# Patient Record
Sex: Male | Born: 1965 | Race: White | Hispanic: No | Marital: Married | Smoking: Never smoker
Health system: Southern US, Community
[De-identification: ages and names within clinical notes are randomized; demographics above are authoritative.]

## PROBLEM LIST (undated history)

## (undated) DIAGNOSIS — T7840XA Allergy, unspecified, initial encounter: Secondary | ICD-10-CM

## (undated) DIAGNOSIS — C61 Malignant neoplasm of prostate: Secondary | ICD-10-CM

## (undated) DIAGNOSIS — Z87898 Personal history of other specified conditions: Secondary | ICD-10-CM

## (undated) DIAGNOSIS — M7630 Iliotibial band syndrome, unspecified leg: Secondary | ICD-10-CM

## (undated) HISTORY — PX: KNEE SURGERY: SHX244

## (undated) HISTORY — DX: Personal history of other specified conditions: Z87.898

## (undated) HISTORY — DX: Malignant neoplasm of prostate: C61

## (undated) HISTORY — DX: Iliotibial band syndrome, unspecified leg: M76.30

## (undated) HISTORY — DX: Allergy, unspecified, initial encounter: T78.40XA

---

## 1993-11-24 HISTORY — PX: APPENDECTOMY: SHX54

## 2013-03-30 ENCOUNTER — Encounter: Payer: Self-pay | Admitting: Family Medicine

## 2013-03-30 ENCOUNTER — Ambulatory Visit (INDEPENDENT_AMBULATORY_CARE_PROVIDER_SITE_OTHER): Payer: 59 | Admitting: Family Medicine

## 2013-03-30 VITALS — BP 130/80 | Temp 98.2°F | Ht 70.0 in | Wt 182.0 lb

## 2013-03-30 DIAGNOSIS — Z0000 Encounter for general adult medical examination without abnormal findings: Secondary | ICD-10-CM | POA: Insufficient documentation

## 2013-03-30 LAB — CBC WITH DIFFERENTIAL/PLATELET
Basophils Relative: 0.9 % (ref 0.0–3.0)
Eosinophils Relative: 4.3 % (ref 0.0–5.0)
MCV: 92.8 fl (ref 78.0–100.0)
Monocytes Absolute: 0.5 10*3/uL (ref 0.1–1.0)
Monocytes Relative: 9.5 % (ref 3.0–12.0)
Neutrophils Relative %: 56.1 % (ref 43.0–77.0)
Platelets: 178 10*3/uL (ref 150.0–400.0)
RBC: 4.69 Mil/uL (ref 4.22–5.81)
WBC: 4.9 10*3/uL (ref 4.5–10.5)

## 2013-03-30 LAB — LIPID PANEL
Cholesterol: 220 mg/dL — ABNORMAL HIGH (ref 0–200)
HDL: 60 mg/dL (ref >39.00)
Total CHOL/HDL Ratio: 4
Triglycerides: 79 mg/dL (ref 0.0–149.0)

## 2013-03-30 LAB — POCT URINALYSIS DIPSTICK
Bilirubin, UA: NEGATIVE
Glucose, UA: NEGATIVE
Ketones, UA: NEGATIVE
Leukocytes, UA: NEGATIVE
Protein, UA: NEGATIVE
Spec Grav, UA: 1.015

## 2013-03-30 LAB — HEPATIC FUNCTION PANEL
ALT: 22 U/L (ref 0–53)
Albumin: 4.2 g/dL (ref 3.5–5.2)
Total Bilirubin: 1.5 mg/dL — ABNORMAL HIGH (ref 0.3–1.2)

## 2013-03-30 LAB — BASIC METABOLIC PANEL
CO2: 27 mEq/L (ref 19–32)
Chloride: 106 mEq/L (ref 96–112)
Potassium: 4.7 mEq/L (ref 3.5–5.1)
Sodium: 138 mEq/L (ref 135–145)

## 2013-03-30 LAB — TSH: TSH: 1.4 u[IU]/mL (ref 0.35–5.50)

## 2013-03-30 LAB — LDL CHOLESTEROL, DIRECT: Direct LDL: 143.3 mg/dL

## 2013-03-30 NOTE — Progress Notes (Signed)
Subjective:    Patient ID: Brian Lowery, male    DOB: 05-14-1966, 47 y.o.   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 No complications since then.  He had an appendectomy in 1994, arthroscopic surgery left knee for torn cartilage . He's had no major illnesses or injuries. He does have springtime pollen allergy. As a child he took allergy shots. This spring things have been quiet allergy wise. He does not smoke he only drinks an occasional drink of alcohol.  Review of systems HEENT were negative except for some high frequency hearing loss bilaterally. He used to be on a Youth worker. Also as a [place] and was exposed to gun shot noise. He gets regular dental care. Cardiopulmonary review of systems negative GI negative GU negative except for a facetectomy and urologic surgery as noted above. Musculoskeletal negative except for the torn cartilage as noted above he does have allergies no asthma. Family history dad has hyperlipidemia at age 30 mother has some hyperlipidemia one brother in good health no sisters. He thinks his vaccinations are up-to-date .  He has yearly exams by Dr. 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 He states his high frequency hearing loss is stable. He also at that yearly cardiac stress test and pulmonary function studies which have all been normal.    He also has a plantar wart on his right heel  He is married and has 2 lovely children   Review of Systems  Constitutional: Negative.   HENT: Negative.   Eyes: Negative.   Respiratory: Negative.   Cardiovascular: Negative.   Gastrointestinal: Negative.   Genitourinary: Negative.   Musculoskeletal: Negative.    Skin: Negative.   Neurological: Negative.   Psychiatric/Behavioral: Negative.        Objective:   Physical Exam  Constitutional: He is oriented to person, place, and time. He appears well-developed and well-nourished.  HENT:  Head: Normocephalic and atraumatic.  Right Ear: External ear normal.  Left Ear: External ear normal.  Nose: Nose normal.  Mouth/Throat: Oropharynx is clear and moist.  Eyes: Conjunctivae and EOM are normal. Pupils are equal, round, and reactive to light.  Neck: Normal range of motion. Neck supple. No JVD present. No tracheal deviation present. No thyromegaly present.  Cardiovascular: Normal rate, regular rhythm, normal heart sounds and intact distal pulses.  Exam reveals no gallop and no friction rub.   No murmur heard. Pulmonary/Chest: Effort normal and breath sounds normal. No stridor. No respiratory distress. He has no wheezes. He has no rales. He exhibits no tenderness.  Abdominal: Soft. Bowel sounds are normal. He exhibits no distension and no mass. There is no tenderness. There is no rebound and no guarding.  Scar lower pubic area from previous urologic surgery at age 8  Genitourinary: Rectum normal, prostate normal and penis normal. Guaiac negative stool. No penile tenderness.  Musculoskeletal: Normal range of motion. He exhibits no edema and no tenderness.  Lymphadenopathy:    He has no cervical adenopathy.  Neurological: He is alert and oriented to person, place, and time. He has normal reflexes. No cranial nerve deficit. He exhibits normal muscle tone.  Skin: Skin is warm and dry. No rash noted. No  erythema. No pallor.  Total body skin exam normal. He has a garden Friday of freckles some moles and capillary hemangiomas all of which appear to be benign.  The plantar wart was frozen with liquid nitrogen  Psychiatric: He has a normal mood and affect. His behavior is normal. Judgment and thought content normal.          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

## 2013-03-30 NOTE — Patient Instructions (Signed)
Continue your good health habits  Apply DuoFilm nightly to the wart on your right heel  If after 3-4 weeks it is not resolved return for retreatment  Ear  protection with any noise exposure  Return in one year for general physical examination sooner if any problems  Remember to wear sunscreens SPF 50+

## 2013-04-04 ENCOUNTER — Encounter: Payer: Self-pay | Admitting: *Deleted

## 2013-06-02 ENCOUNTER — Telehealth: Payer: Self-pay | Admitting: Family Medicine

## 2013-06-02 MED ORDER — NEOMYCIN-POLYMYXIN-HC 3.5-10000-1 OP SUSP: 1.0000 [drp] | Freq: Two times a day (BID) | OPHTHALMIC | Status: DC

## 2013-06-02 NOTE — Telephone Encounter (Signed)
Pt has pink eye or something going on w/ his eye.  Pt leaving on vacation tomorrow and would like to get this chked out. Pt has meeting in the afternnoon. Can we get in this am??

## 2013-06-02 NOTE — Telephone Encounter (Signed)
Per Dr Tawanna Cooler.  Rx sent to pharmacy and patient is aware

## 2013-12-29 ENCOUNTER — Encounter: Payer: Self-pay | Admitting: Family Medicine

## 2013-12-29 ENCOUNTER — Ambulatory Visit (INDEPENDENT_AMBULATORY_CARE_PROVIDER_SITE_OTHER): Payer: 59 | Admitting: Family Medicine

## 2013-12-29 VITALS — BP 120/80 | Temp 97.9°F | Wt 173.0 lb

## 2013-12-29 DIAGNOSIS — J309 Allergic rhinitis, unspecified: Secondary | ICD-10-CM

## 2013-12-29 MED ORDER — FLUTICASONE PROPIONATE 50 MCG/ACT NA SUSP: 2.0000 | Freq: Every day | NASAL | Status: DC

## 2013-12-29 MED ORDER — PREDNISONE 20 MG PO TABS: ORAL_TABLET | ORAL | Status: DC

## 2013-12-29 NOTE — Progress Notes (Signed)
   Subjective:    Patient ID: Brian Lowery, male    DOB: 01/26/1966, 48 y.o.   MRN: [id number]  HPI  Brian Lowery is a 48 year old married male nonsmoker who comes in today for evaluation of a 2 day history of allergic rhinitis of unknown triggers.  Historically when he was young man he had a lot of allergies. He took allergy shots for 7 years. Now as an adult his allergies more spring time. He usually premedicate himself with some antihistamines before the season starts.  2 days ago he began having head congestion sinus pressure postnasal drip. He does not recall a particular trigger. It usually doesn't bother and this time a year  Review of Systems Your systems negative    Objective:   Physical Exam  Well-developed and nourished male no acute distress vital signs stable he is afebrile examination HEENT was pertinent in that he has a slight septal deviation to the left and 4+ nasal edema neck was supple no adenopathy      Assessment & Plan:  Allergic rhinitis flare question etiology

## 2013-12-29 NOTE — Patient Instructions (Signed)
Prednisone 20 mg......... 2 tabs x3 days, 1 tab x3 days, a half a tab x3 days, then a half a tablet Monday Wednesday Friday for a two-week taper  Zyrtec 10 mg plain............ steroid nasal spray 1 shot up each nostril at bedtime........... when you are finished with the prednisone

## 2015-01-01 ENCOUNTER — Encounter: Payer: Self-pay | Admitting: Family Medicine

## 2015-01-01 ENCOUNTER — Ambulatory Visit (INDEPENDENT_AMBULATORY_CARE_PROVIDER_SITE_OTHER): Payer: 59 | Admitting: Family Medicine

## 2015-01-01 VITALS — BP 120/80 | Temp 98.4°F | Wt 173.0 lb

## 2015-01-01 DIAGNOSIS — E789 Disorder of lipoprotein metabolism, unspecified: Secondary | ICD-10-CM | POA: Insufficient documentation

## 2015-01-01 NOTE — Progress Notes (Signed)
   Subjective:    Patient ID: CARTIER WASHKO, male    DOB: October 05, 1966, 49 y.o.   MRN: [id number]  HPI [name] is a 49 year old married male nonsmoker who works for the [employer] who comes in today for follow-up  He had some testing done at the occupational has center by Kristine Royal Hott M.D. which was basically normal. His cholesterol total was slightly elevated however his HDL was high and his LDL was not significantly elevated.  He tells me his mother has high cholesterol is on medication but is not had any vascular events. Father's okay one brother okay.  He's also concerned because his PSA is 3. He does not recall what his previous PSAs were. He had them done before age 57 because of concern no family history of prostate cancer. Urologically he's asymptomatic   Review of Systems Review of systems otherwise negative    Objective:   Physical Exam  Well-developed well-nourished male no acute distress vital signs stable he is afebrile I reviewed all his labs are reassuring that his lipids were normal. PSA was 3 but still under for. He's not sure what his previous numbers were. He'll give me a copy of those numbers and will have another discussion.      Assessment & Plan:  Slightly elevated cholesterol not significant........ continue diet and exercise

## 2015-01-01 NOTE — Progress Notes (Signed)
Pre visit review using our clinic review tool, if applicable. No additional management support is needed unless otherwise documented below in the visit note. 

## 2015-11-25 HISTORY — PX: VASECTOMY: SHX75

## 2016-07-03 ENCOUNTER — Encounter: Payer: Self-pay | Admitting: Family Medicine

## 2016-07-03 ENCOUNTER — Ambulatory Visit (INDEPENDENT_AMBULATORY_CARE_PROVIDER_SITE_OTHER): Payer: 59 | Admitting: Family Medicine

## 2016-07-03 VITALS — BP 118/82 | HR 60 | Temp 98.1°F | Ht 70.0 in | Wt 178.0 lb

## 2016-07-03 DIAGNOSIS — J069 Acute upper respiratory infection, unspecified: Secondary | ICD-10-CM

## 2016-07-03 DIAGNOSIS — J309 Allergic rhinitis, unspecified: Secondary | ICD-10-CM

## 2016-07-03 NOTE — Progress Notes (Signed)
Pre visit review using our clinic review tool, if applicable. No additional management support is needed unless otherwise documented below in the visit note. 

## 2016-07-03 NOTE — Patient Instructions (Signed)
-  zyrtec daily  -flonase 2 sprays each nostril daily for 21 days, then 1 spray each nostril daily if you need to continue  -Afrin nasal spray twice daily for 4 days, then stop - do not use longer then 4 days  -seek care if worsening, new symptoms or symptoms persist longer then expected   INSTRUCTIONS FOR UPPER RESPIRATORY INFECTION:   -nasal saline wash 2-3 times daily (use prepackaged nasal saline or bottled/distilled water if making your own)   -can use tylenol (in no history of liver disease) or ibuprofen (if no history of kidney disease, bowel bleeding or significant heart disease) as directed for aches and sorethroat  -if you are taking a cough medication - use only as directed, may also try a teaspoon of honey to coat the throat and throat lozenges.   -for sore throat, salt water gargles can help  -follow up if you have fevers, facial pain, tooth pain, difficulty breathing or are worsening or symptoms persist longer then expected  Upper Respiratory Infection, Adult An upper respiratory infection (URI) is also known as the common cold. It is often caused by a type of germ (virus). Colds are easily spread (contagious). You can pass it to others by kissing, coughing, sneezing, or drinking out of the same glass. Usually, you get better in 1 to 3  weeks.  However, the cough can last for even longer. HOME CARE   Only take medicine as told by your doctor. Follow instructions provided above.  Drink enough water and fluids to keep your pee (urine) clear or pale yellow.  Get plenty of rest.  Return to work when your temperature is < 100 for 24 hours or as told by your doctor. You may use a face mask and wash your hands to stop your cold from spreading. GET HELP RIGHT AWAY IF:   After the first few days, you feel you are getting worse.  You have questions about your medicine.  You have chills, shortness of breath, or red spit (mucus).  You have pain in the face for more then 1-2  days, especially when you bend forward.  You have a fever, puffy (swollen) neck, pain when you swallow, or white spots in the back of your throat.  You have a bad headache, ear pain, sinus pain, or chest pain.  You have a high-pitched whistling sound when you breathe in and out (wheezing).  You cough up blood.  You have sore muscles or a stiff neck. MAKE SURE YOU:   Understand these instructions.  Will watch your condition.  Will get help right away if you are not doing well or get worse. Document Released: 04/28/2008 Document Revised: 02/02/2012 Document Reviewed: 02/15/2014 [facility] Patient Information 2015 [place], [place]. This information is not intended to replace advice given to you by your health care provider. Make sure you discuss any questions you have with your health care provider.

## 2016-07-03 NOTE — Progress Notes (Signed)
HPI:  Acute visit for URI: -started: allergy issues a few weeks ago, better with zyrtec, then whole family got a cold the last few days and he felt worse, better today -symptoms:nasal congestion, sore throat, cough, ears feel full -denies:fever, SOB, NVD, tooth pain (some a few days ago, now resolved), sinus pain, thick or discolored congestion -has tried: zyrtec -sick contacts/travel/risks: no reported flu, strep or tick exposure - whole family with a cold -Hx of: allergies  ROS: See pertinent positives and negatives per HPI.  Past Medical History:  Diagnosis Date  . Allergy     Past Surgical History:  Procedure Laterality Date  . APPENDECTOMY      Family History  Problem Relation Age of Onset  . Hyperlipidemia Mother   . Hypertension Father   . Stroke Paternal Grandfather   . Heart disease Other     Social History   Social History  . Marital status: Married    Spouse name: N/A  . Number of children: N/A  . Years of education: N/A   Social History Main Topics  . Smoking status: Never Smoker  . Smokeless tobacco: None  . Alcohol use None  . Drug use: Unknown  . Sexual activity: Not Asked   Other Topics Concern  . None   Social History Narrative  . None     Current Outpatient Prescriptions:  .  cetirizine (ZYRTEC) 10 MG tablet, Take 10 mg by mouth as needed for allergies., Disp: , Rfl:  .  fluticasone (FLONASE) 50 MCG/ACT nasal spray, Place into both nostrils daily., Disp: , Rfl:   EXAM:  Vitals:   07/03/16 0936  BP: 118/82  Pulse: 60    Body mass index is 25.54 kg/m.  GENERAL: vitals reviewed and listed above, alert, oriented, appears well hydrated and in no acute distress  HEENT: atraumatic, conjunttiva clear, no obvious abnormalities on inspection of external nose and ears, normal appearance of ear canals and TMs, clear nasal congestion, mild post oropharyngeal erythema with PND, no tonsillar edema or exudate, no sinus TTP  NECK: no  obvious masses on inspection  LUNGS: clear to auscultation bilaterally, no wheezes, rales or rhonchi, good air movement  CV: HRRR, no peripheral edema  MS: moves all extremities without noticeable abnormality  PSYCH: pleasant and cooperative, no obvious depression or anxiety  ASSESSMENT AND PLAN:  Discussed the following assessment and plan:  Allergic rhinitis, unspecified allergic rhinitis type  Acute upper respiratory infection  -given HPI and exam findings today, a serious infection or illness is unlikely. We discussed potential etiologies, with VURI along with AR being most likely. We discussed treatment side effects, likely course, antibiotic misuse, transmission, and signs of developing a serious illness. -zyrtec and flonase, short course nasal decongestant -of course, we advised to return or notify a doctor immediately if symptoms worsen or persist or new concerns arise.    Patient Instructions  -zyrtec daily  -flonase 2 sprays each nostril daily for 21 days, then 1 spray each nostril daily if you need to continue  -Afrin nasal spray twice daily for 4 days, then stop - do not use longer then 4 days  -seek care if worsening, new symptoms or symptoms persist longer then expected   INSTRUCTIONS FOR UPPER RESPIRATORY INFECTION:   -nasal saline wash 2-3 times daily (use prepackaged nasal saline or bottled/distilled water if making your own)   -can use tylenol (in no history of liver disease) or ibuprofen (if no history of kidney disease, bowel bleeding  or significant heart disease) as directed for aches and sorethroat  -if you are taking a cough medication - use only as directed, may also try a teaspoon of honey to coat the throat and throat lozenges.   -for sore throat, salt water gargles can help  -follow up if you have fevers, facial pain, tooth pain, difficulty breathing or are worsening or symptoms persist longer then expected  Upper Respiratory Infection,  Adult An upper respiratory infection (URI) is also known as the common cold. It is often caused by a type of germ (virus). Colds are easily spread (contagious). You can pass it to others by kissing, coughing, sneezing, or drinking out of the same glass. Usually, you get better in 1 to 3  weeks.  However, the cough can last for even longer. HOME CARE   Only take medicine as told by your doctor. Follow instructions provided above.  Drink enough water and fluids to keep your pee (urine) clear or pale yellow.  Get plenty of rest.  Return to work when your temperature is < 100 for 24 hours or as told by your doctor. You may use a face mask and wash your hands to stop your cold from spreading. GET HELP RIGHT AWAY IF:   After the first few days, you feel you are getting worse.  You have questions about your medicine.  You have chills, shortness of breath, or red spit (mucus).  You have pain in the face for more then 1-2 days, especially when you bend forward.  You have a fever, puffy (swollen) neck, pain when you swallow, or white spots in the back of your throat.  You have a bad headache, ear pain, sinus pain, or chest pain.  You have a high-pitched whistling sound when you breathe in and out (wheezing).  You cough up blood.  You have sore muscles or a stiff neck. MAKE SURE YOU:   Understand these instructions.  Will watch your condition.  Will get help right away if you are not doing well or get worse. Document Released: 04/28/2008 Document Revised: 02/02/2012 Document Reviewed: 02/15/2014 [facility] Patient Information 2015 [place], [place]. This information is not intended to replace advice given to you by your health care provider. Make sure you discuss any questions you have with your health care provider.    Colin Benton R., DO

## 2016-11-18 ENCOUNTER — Other Ambulatory Visit (INDEPENDENT_AMBULATORY_CARE_PROVIDER_SITE_OTHER): Payer: 59

## 2016-11-18 DIAGNOSIS — Z0000 Encounter for general adult medical examination without abnormal findings: Secondary | ICD-10-CM | POA: Diagnosis not present

## 2016-11-18 LAB — HEPATIC FUNCTION PANEL
ALBUMIN: 4.3 g/dL (ref 3.5–5.2)
ALK PHOS: 75 U/L (ref 39–117)
ALT: 17 U/L (ref 0–53)
AST: 16 U/L (ref 0–37)
Bilirubin, Direct: 0.1 mg/dL (ref 0.0–0.3)
TOTAL PROTEIN: 6.2 g/dL (ref 6.0–8.3)
Total Bilirubin: 0.8 mg/dL (ref 0.2–1.2)

## 2016-11-18 LAB — TSH: TSH: 1.84 u[IU]/mL (ref 0.35–4.50)

## 2016-11-18 LAB — POC URINALSYSI DIPSTICK (AUTOMATED)
BILIRUBIN UA: NEGATIVE
GLUCOSE UA: NEGATIVE
Ketones, UA: NEGATIVE
Leukocytes, UA: NEGATIVE
Nitrite, UA: NEGATIVE
PH UA: 7
Protein, UA: NEGATIVE
RBC UA: NEGATIVE
SPEC GRAV UA: 1.015
UROBILINOGEN UA: 0.2

## 2016-11-18 LAB — LIPID PANEL
CHOLESTEROL: 223 mg/dL — AB (ref 0–200)
HDL: 66.8 mg/dL (ref >39.00)
LDL Cholesterol: 118 mg/dL — ABNORMAL HIGH (ref 0–99)
NONHDL: 155.73
Total CHOL/HDL Ratio: 3
Triglycerides: 189 mg/dL — ABNORMAL HIGH (ref 0.0–149.0)
VLDL: 37.8 mg/dL (ref 0.0–40.0)

## 2016-11-18 LAB — BASIC METABOLIC PANEL
BUN: 11 mg/dL (ref 6–23)
CHLORIDE: 106 meq/L (ref 96–112)
CO2: 29 mEq/L (ref 19–32)
Calcium: 9.2 mg/dL (ref 8.4–10.5)
Creatinine, Ser: 0.87 mg/dL (ref 0.40–1.50)
GFR: 98.58 mL/min (ref >60.00)
Glucose, Bld: 90 mg/dL (ref 70–99)
Potassium: 4.6 mEq/L (ref 3.5–5.1)
SODIUM: 143 meq/L (ref 135–145)

## 2016-11-18 LAB — PSA: PSA: 2.11 ng/mL (ref 0.10–4.00)

## 2016-11-18 LAB — CBC WITH DIFFERENTIAL/PLATELET
BASOS ABS: 0 10*3/uL (ref 0.0–0.1)
Basophils Relative: 0.5 % (ref 0.0–3.0)
EOS PCT: 8.2 % — AB (ref 0.0–5.0)
Eosinophils Absolute: 0.5 10*3/uL (ref 0.0–0.7)
HEMATOCRIT: 43.8 % (ref 39.0–52.0)
HEMOGLOBIN: 15 g/dL (ref 13.0–17.0)
LYMPHS PCT: 23.8 % (ref 12.0–46.0)
Lymphs Abs: 1.4 10*3/uL (ref 0.7–4.0)
MCHC: 34.2 g/dL (ref 30.0–36.0)
MCV: 90.4 fl (ref 78.0–100.0)
MONOS PCT: 11.3 % (ref 3.0–12.0)
Monocytes Absolute: 0.7 10*3/uL (ref 0.1–1.0)
NEUTROS PCT: 56.2 % (ref 43.0–77.0)
Neutro Abs: 3.3 10*3/uL (ref 1.4–7.7)
Platelets: 202 10*3/uL (ref 150.0–400.0)
RBC: 4.85 Mil/uL (ref 4.22–5.81)
RDW: 13.8 % (ref 11.5–15.5)
WBC: 5.9 10*3/uL (ref 4.0–10.5)

## 2016-11-25 ENCOUNTER — Encounter: Payer: 59 | Admitting: Family Medicine

## 2016-12-02 ENCOUNTER — Encounter: Payer: 59 | Admitting: Family Medicine

## 2016-12-03 ENCOUNTER — Ambulatory Visit (INDEPENDENT_AMBULATORY_CARE_PROVIDER_SITE_OTHER): Payer: 59 | Admitting: Family Medicine

## 2016-12-03 ENCOUNTER — Encounter: Payer: Self-pay | Admitting: Family Medicine

## 2016-12-03 VITALS — BP 122/80 | HR 65 | Temp 98.3°F | Ht 70.0 in | Wt 183.6 lb

## 2016-12-03 DIAGNOSIS — Z0000 Encounter for general adult medical examination without abnormal findings: Secondary | ICD-10-CM | POA: Diagnosis not present

## 2016-12-03 DIAGNOSIS — J309 Allergic rhinitis, unspecified: Secondary | ICD-10-CM

## 2016-12-03 DIAGNOSIS — Z23 Encounter for immunization: Secondary | ICD-10-CM | POA: Diagnosis not present

## 2016-12-03 MED ORDER — MONTELUKAST SODIUM 10 MG PO TABS: 10.0000 mg | ORAL_TABLET | Freq: Every day | ORAL | 3 refills | Status: DC

## 2016-12-03 MED ORDER — FLUTICASONE PROPIONATE 50 MCG/ACT NA SUSP: 1.0000 | Freq: Every day | NASAL | 6 refills | Status: DC

## 2016-12-03 NOTE — Patient Instructions (Signed)
Continue good diet exercise and health habits.  Follow-up in one year sooner if any problems  You'll get a call from GI about an appointment to discuss screening colonoscopy  Add Singulair 10 mg.......... one daily at bedtime if the Zyrtec and steroid nasal spray or not helping her allergy symptoms.

## 2016-12-03 NOTE — Progress Notes (Signed)
Brian Lowery is a 51 year old married male nonsmoker who works with the [employer] who comes in today for general physical examination he has underlying allergic rhinitis. He uses Zyrtec and steroid nasal spray.  He's always been in excellent health he said no chronic health problems except above.  He gets routine eye care, dental care, due for screening colonoscopy at age 75. No family history of colon cancer polyps.  He has screening through: Occupational health by Dr. 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 Screening includes physical exam hearing PFT and labs. This was done in the spring of 2017. All studies were normal.  Mother recent diagnosed with breast cancer dates 79. She also has high cholesterol.. Dad has hypertension and A. fib. No sisters one brother and she is in good health  Vaccinations last tetanus booster was in 2006. He will be given a booster today.Marland Kitchen He had a flu shot at the work. November 2017.  Social history,,,,,,,,,, married lives here in [place] 2 children works for the [employer], trade union. Excellent physical shape he bicycles sometimes up to 40 miles at a time.  In the past he's had an elevated LDL which is borderline. Through diet and exercise she's dropped his LDL down to 118.  14 point review of systems reviewed and otherwise negative  s  BP 122/80 (BP Location: Left Arm, Patient Position: Sitting, Cuff Size: Normal)   Pulse 65   Temp 98.3 F (36.8 C) (Oral)   Ht 5\' 10"  (1.778 m)   Wt 183 lb 9.6 oz (83.3 kg)   BMI 26.34 kg/m  Examination of the HEENT were negative neck was supple thyroid is nonenlarged no carotid bruits cardiopulmonary exam normal abdominal exam normal genitalia normal circumcised male rectum normal stool guaiac-negative prostate normal. Extremities normal skin normal peripheral pulses normal.  #1 healthy male  #2 allergic rhinitis continue medications as above at Singulair if above is not working.Marland Kitchenv

## 2016-12-08 ENCOUNTER — Encounter: Payer: Self-pay | Admitting: Gastroenterology

## 2017-01-12 ENCOUNTER — Ambulatory Visit (AMBULATORY_SURGERY_CENTER): Payer: Self-pay

## 2017-01-12 VITALS — Ht 70.0 in | Wt 178.0 lb

## 2017-01-12 DIAGNOSIS — Z1211 Encounter for screening for malignant neoplasm of colon: Secondary | ICD-10-CM

## 2017-01-12 MED ORDER — SUPREP BOWEL PREP KIT 17.5-3.13-1.6 GM/177ML PO SOLN: 1.0000 | Freq: Once | ORAL | 0 refills | Status: AC

## 2017-01-12 NOTE — Progress Notes (Signed)
No allergies to eggs or soy No past problems with anesthesia No diet meds No home oxygen  Registered for emmi 

## 2017-01-20 DIAGNOSIS — L821 Other seborrheic keratosis: Secondary | ICD-10-CM | POA: Diagnosis not present

## 2017-01-20 DIAGNOSIS — D1801 Hemangioma of skin and subcutaneous tissue: Secondary | ICD-10-CM | POA: Diagnosis not present

## 2017-01-20 DIAGNOSIS — L814 Other melanin hyperpigmentation: Secondary | ICD-10-CM | POA: Diagnosis not present

## 2017-01-22 ENCOUNTER — Telehealth: Payer: Self-pay | Admitting: Gastroenterology

## 2017-01-22 NOTE — Telephone Encounter (Signed)
Reprinted Suprep instructions with new date and time and snail mailed a hard-copy to patient.  Patient called and informed per Riki Sheer RN. Tomie Elko/PV

## 2017-01-26 ENCOUNTER — Encounter: Payer: 59 | Admitting: Gastroenterology

## 2017-01-29 ENCOUNTER — Encounter: Payer: Self-pay | Admitting: Gastroenterology

## 2017-01-30 ENCOUNTER — Telehealth: Payer: Self-pay | Admitting: Gastroenterology

## 2017-01-30 NOTE — Telephone Encounter (Signed)
Pt notified and aware to come pick up a new coupon for Suprep,

## 2017-02-09 ENCOUNTER — Encounter: Payer: 59 | Admitting: Gastroenterology

## 2017-02-12 ENCOUNTER — Ambulatory Visit (AMBULATORY_SURGERY_CENTER): Payer: 59 | Admitting: Gastroenterology

## 2017-02-12 ENCOUNTER — Encounter: Payer: Self-pay | Admitting: Gastroenterology

## 2017-02-12 VITALS — BP 103/74 | HR 57 | Temp 98.9°F | Resp 18 | Ht 70.0 in | Wt 178.0 lb

## 2017-02-12 DIAGNOSIS — D123 Benign neoplasm of transverse colon: Secondary | ICD-10-CM

## 2017-02-12 DIAGNOSIS — Z1211 Encounter for screening for malignant neoplasm of colon: Secondary | ICD-10-CM | POA: Diagnosis present

## 2017-02-12 DIAGNOSIS — Z1212 Encounter for screening for malignant neoplasm of rectum: Secondary | ICD-10-CM

## 2017-02-12 MED ORDER — SODIUM CHLORIDE 0.9 % IV SOLN: 500.0000 mL | INTRAVENOUS | Status: DC

## 2017-02-12 NOTE — Op Note (Signed)
[facility] Patient Name: Brian Lowery Procedure Date: 02/12/2017 2:39 PM MRN: [id number] Endoscopist: Mallie Mussel L. Loletha Carrow , MD Age: 51 Referring MD:  Date of Birth: October 05, 1966 Gender: Male Account #: [id number] Procedure:                Colonoscopy Indications:              Screening for colorectal malignant neoplasm, This                            is the patient's first colonoscopy Medicines:                Monitored Anesthesia Care Procedure:                Pre-Anesthesia Assessment:                           - Prior to the procedure, a History and Physical                            was performed, and patient medications and                            allergies were reviewed. The patient's tolerance of                            previous anesthesia was also reviewed. The risks                            and benefits of the procedure and the sedation                            options and risks were discussed with the patient.                            All questions were answered, and informed consent                            was obtained. Prior Anticoagulants: The patient has                            taken no previous anticoagulant or antiplatelet                            agents. ASA Grade Assessment: II - A patient with                            mild systemic disease. After reviewing the risks                            and benefits, the patient was deemed in                            satisfactory condition to undergo the procedure.  After obtaining informed consent, the colonoscope                            was passed under direct vision. Throughout the                            procedure, the patient's blood pressure, pulse, and                            oxygen saturations were monitored continuously. The                            Colonoscope was introduced through the anus and                            advanced to the the cecum,  identified by                            appendiceal orifice and ileocecal valve. The                            colonoscopy was performed without difficulty. The                            patient tolerated the procedure well. The quality                            of the bowel preparation was good. The ileocecal                            valve, appendiceal orifice, and rectum were                            photographed. The quality of the bowel preparation                            was evaluated using the BBPS [facility] Bowel                            Preparation Scale) with scores of: Right Colon = 2,                            Transverse Colon = 2 and Left Colon = 2. The total                            BBPS score equals 6. The bowel preparation used was                            SUPREP. Scope In: 3:10:09 PM Scope Out: 3:24:40 PM Scope Withdrawal Time: 0 hours 10 minutes 28 seconds  Total Procedure Duration: 0 hours 14 minutes 31 seconds  Findings:                 The perianal and digital rectal  examinations were                            normal.                           A 2 mm polyp was found in the distal transverse                            colon. The polyp was sessile. The polyp was removed                            with a cold snare. Resection and retrieval were                            complete.                           The exam was otherwise without abnormality on                            direct and retroflexion views. Complications:            No immediate complications. Estimated Blood Loss:     Estimated blood loss: none. Impression:               - One 2 mm polyp in the distal transverse colon,                            removed with a cold snare. Resected and retrieved.                           - The examination was otherwise normal on direct                            and retroflexion views. Recommendation:           - Patient has a contact number available  for                            emergencies. The signs and symptoms of potential                            delayed complications were discussed with the                            patient. Return to normal activities tomorrow.                            Written discharge instructions were provided to the                            patient.                           - Resume previous diet.                           -  Continue present medications.                           - Await pathology results.                           - Repeat colonoscopy is recommended for                            surveillance. The colonoscopy date will be                            determined after pathology results from today's                            exam become available for review. Shontel Santee L. Loletha Carrow, MD 02/12/2017 3:34:02 PM This report has been signed electronically.

## 2017-02-12 NOTE — Patient Instructions (Signed)
Impression/recommendations:  [place] (handout given)  YOU HAD AN ENDOSCOPIC PROCEDURE TODAY AT THE [facility]:   Refer to the procedure report that was given to you for any specific questions about what was found during the examination.  If the procedure report does not answer your questions, please call your gastroenterologist to clarify.  If you requested that your care partner not be given the details of your procedure findings, then the procedure report has been included in a sealed envelope for you to review at your convenience later.  YOU SHOULD EXPECT: Some feelings of bloating in the abdomen. Passage of more gas than usual.  Walking can help get rid of the air that was put into your GI tract during the procedure and reduce the bloating. If you had a lower endoscopy (such as a colonoscopy or flexible sigmoidoscopy) you may notice spotting of blood in your stool or on the toilet paper. If you underwent a bowel prep for your procedure, you may not have a normal bowel movement for a few days.  Please Note:  You might notice some irritation and congestion in your nose or some drainage.  This is from the oxygen used during your procedure.  There is no need for concern and it should clear up in a day or so.  SYMPTOMS TO REPORT IMMEDIATELY:   Following lower endoscopy (colonoscopy or flexible sigmoidoscopy):  Excessive amounts of blood in the stool  Significant tenderness or worsening of abdominal pains  Swelling of the abdomen that is new, acute  Fever of 100F or higher  For urgent or emergent issues, a gastroenterologist can be reached at any hour by calling [phone].   DIET:  We do recommend a small meal at first, but then you may proceed to your regular diet.  Drink plenty of fluids but you should avoid alcoholic beverages for 24 hours.  ACTIVITY:  You should plan to take it easy for the rest of today and you should NOT DRIVE or use heavy machinery until tomorrow  (because of the sedation medicines used during the test).    FOLLOW UP: Our staff will call the number listed on your records the next business day following your procedure to check on you and address any questions or concerns that you may have regarding the information given to you following your procedure. If we do not reach you, we will leave a message.  However, if you are feeling well and you are not experiencing any problems, there is no need to return our call.  We will assume that you have returned to your regular daily activities without incident.  If any biopsies were taken you will be contacted by phone or by letter within the next 1-3 weeks.  Please call us at [phone] if you have not heard about the biopsies in 3 weeks.    SIGNATURES/CONFIDENTIALITY: You and/or your care partner have signed paperwork which will be entered into your electronic medical record.  These signatures attest to the fact that that the information above on your After Visit Summary has been reviewed and is understood.  Full responsibility of the confidentiality of this discharge information lies with you and/or your care-partner. 

## 2017-02-12 NOTE — Progress Notes (Signed)
Pt's states no medical or surgical changes since previsit or office visit. 

## 2017-02-12 NOTE — Progress Notes (Signed)
Called to room to assist during endoscopic procedure.  Patient ID and intended procedure confirmed with present staff. Received instructions for my participation in the procedure from the performing physician.  

## 2017-02-12 NOTE — Progress Notes (Signed)
To PACU, vss patent aw report to rn 

## 2017-02-13 ENCOUNTER — Telehealth: Payer: Self-pay | Admitting: *Deleted

## 2017-02-13 NOTE — Telephone Encounter (Signed)
  Follow up Call-  Call back number 02/12/2017  Post procedure Call Back phone  # [phone]  Permission to leave phone message Yes  Some recent data might be hidden     Patient questions:  Do you have a fever, pain , or abdominal swelling? No. Pain Score  0 *  Have you tolerated food without any problems? Yes.    Have you been able to return to your normal activities? Yes.    Do you have any questions about your discharge instructions: Diet   No. Medications  No. Follow up visit  No.  Do you have questions or concerns about your Care? No.  Actions: * If pain score is 4 or above: No action needed, pain <4.

## 2017-02-18 ENCOUNTER — Encounter: Payer: Self-pay | Admitting: Gastroenterology

## 2017-04-23 DIAGNOSIS — H524 Presbyopia: Secondary | ICD-10-CM | POA: Diagnosis not present

## 2017-04-23 DIAGNOSIS — H5203 Hypermetropia, bilateral: Secondary | ICD-10-CM | POA: Diagnosis not present

## 2017-10-17 DIAGNOSIS — R030 Elevated blood-pressure reading, without diagnosis of hypertension: Secondary | ICD-10-CM | POA: Diagnosis not present

## 2017-10-17 DIAGNOSIS — J019 Acute sinusitis, unspecified: Secondary | ICD-10-CM | POA: Diagnosis not present

## 2017-12-28 ENCOUNTER — Ambulatory Visit
Admission: RE | Admit: 2017-12-28 | Discharge: 2017-12-28 | Disposition: A | Discharge status: Home / Self Care | Payer: 59 | Source: Ambulatory Visit | Attending: Chiropractic Medicine | Admitting: Chiropractic Medicine

## 2017-12-28 ENCOUNTER — Other Ambulatory Visit: Payer: Self-pay | Admitting: Chiropractic Medicine

## 2017-12-28 DIAGNOSIS — M9905 Segmental and somatic dysfunction of pelvic region: Secondary | ICD-10-CM | POA: Diagnosis not present

## 2017-12-28 DIAGNOSIS — M9903 Segmental and somatic dysfunction of lumbar region: Secondary | ICD-10-CM | POA: Diagnosis not present

## 2017-12-28 DIAGNOSIS — M25551 Pain in right hip: Secondary | ICD-10-CM | POA: Diagnosis not present

## 2017-12-28 DIAGNOSIS — M9904 Segmental and somatic dysfunction of sacral region: Secondary | ICD-10-CM | POA: Diagnosis not present

## 2018-01-01 DIAGNOSIS — M9904 Segmental and somatic dysfunction of sacral region: Secondary | ICD-10-CM | POA: Diagnosis not present

## 2018-01-01 DIAGNOSIS — M9905 Segmental and somatic dysfunction of pelvic region: Secondary | ICD-10-CM | POA: Diagnosis not present

## 2018-01-01 DIAGNOSIS — M9903 Segmental and somatic dysfunction of lumbar region: Secondary | ICD-10-CM | POA: Diagnosis not present

## 2018-01-04 DIAGNOSIS — M9903 Segmental and somatic dysfunction of lumbar region: Secondary | ICD-10-CM | POA: Diagnosis not present

## 2018-01-04 DIAGNOSIS — M9905 Segmental and somatic dysfunction of pelvic region: Secondary | ICD-10-CM | POA: Diagnosis not present

## 2018-01-04 DIAGNOSIS — M9904 Segmental and somatic dysfunction of sacral region: Secondary | ICD-10-CM | POA: Diagnosis not present

## 2018-01-15 DIAGNOSIS — M9903 Segmental and somatic dysfunction of lumbar region: Secondary | ICD-10-CM | POA: Diagnosis not present

## 2018-01-15 DIAGNOSIS — M9904 Segmental and somatic dysfunction of sacral region: Secondary | ICD-10-CM | POA: Diagnosis not present

## 2018-01-15 DIAGNOSIS — M9905 Segmental and somatic dysfunction of pelvic region: Secondary | ICD-10-CM | POA: Diagnosis not present

## 2018-02-15 DIAGNOSIS — L814 Other melanin hyperpigmentation: Secondary | ICD-10-CM | POA: Diagnosis not present

## 2018-02-15 DIAGNOSIS — D1801 Hemangioma of skin and subcutaneous tissue: Secondary | ICD-10-CM | POA: Diagnosis not present

## 2018-02-15 DIAGNOSIS — L821 Other seborrheic keratosis: Secondary | ICD-10-CM | POA: Diagnosis not present

## 2018-03-30 DIAGNOSIS — H10413 Chronic giant papillary conjunctivitis, bilateral: Secondary | ICD-10-CM | POA: Diagnosis not present

## 2018-04-28 DIAGNOSIS — M9904 Segmental and somatic dysfunction of sacral region: Secondary | ICD-10-CM | POA: Diagnosis not present

## 2018-04-28 DIAGNOSIS — M5442 Lumbago with sciatica, left side: Secondary | ICD-10-CM | POA: Diagnosis not present

## 2018-04-28 DIAGNOSIS — M9903 Segmental and somatic dysfunction of lumbar region: Secondary | ICD-10-CM | POA: Diagnosis not present

## 2018-05-03 DIAGNOSIS — M9903 Segmental and somatic dysfunction of lumbar region: Secondary | ICD-10-CM | POA: Diagnosis not present

## 2018-05-03 DIAGNOSIS — M5442 Lumbago with sciatica, left side: Secondary | ICD-10-CM | POA: Diagnosis not present

## 2018-05-03 DIAGNOSIS — M9904 Segmental and somatic dysfunction of sacral region: Secondary | ICD-10-CM | POA: Diagnosis not present

## 2018-07-15 DIAGNOSIS — M9904 Segmental and somatic dysfunction of sacral region: Secondary | ICD-10-CM | POA: Diagnosis not present

## 2018-07-15 DIAGNOSIS — M5442 Lumbago with sciatica, left side: Secondary | ICD-10-CM | POA: Diagnosis not present

## 2018-07-15 DIAGNOSIS — M9903 Segmental and somatic dysfunction of lumbar region: Secondary | ICD-10-CM | POA: Diagnosis not present

## 2018-07-27 DIAGNOSIS — M9904 Segmental and somatic dysfunction of sacral region: Secondary | ICD-10-CM | POA: Diagnosis not present

## 2018-07-27 DIAGNOSIS — M9903 Segmental and somatic dysfunction of lumbar region: Secondary | ICD-10-CM | POA: Diagnosis not present

## 2018-07-27 DIAGNOSIS — M9905 Segmental and somatic dysfunction of pelvic region: Secondary | ICD-10-CM | POA: Diagnosis not present

## 2019-01-12 LAB — PSA: PSA: 2.9

## 2019-01-12 LAB — LIPID PANEL
Cholesterol: 243 — AB (ref 0–200)
HDL: 68 (ref 35–70)
LDL Cholesterol: 153
Triglycerides: 107 (ref 40–160)

## 2019-01-12 LAB — BASIC METABOLIC PANEL: Glucose: 86

## 2019-02-16 ENCOUNTER — Telehealth: Payer: Self-pay

## 2019-02-16 NOTE — Telephone Encounter (Signed)
Left message making patient aware that her visit will be a virtual visit and to check their. WebEx appointment made and e-mail has been sent to the patient.

## 2019-02-23 ENCOUNTER — Other Ambulatory Visit: Payer: Self-pay

## 2019-02-23 ENCOUNTER — Encounter: Payer: Self-pay | Admitting: Family Medicine

## 2019-02-23 ENCOUNTER — Ambulatory Visit (INDEPENDENT_AMBULATORY_CARE_PROVIDER_SITE_OTHER): Payer: 59 | Admitting: Family Medicine

## 2019-02-23 DIAGNOSIS — H9193 Unspecified hearing loss, bilateral: Secondary | ICD-10-CM | POA: Diagnosis not present

## 2019-02-23 DIAGNOSIS — R972 Elevated prostate specific antigen [PSA]: Secondary | ICD-10-CM

## 2019-02-23 DIAGNOSIS — E785 Hyperlipidemia, unspecified: Secondary | ICD-10-CM

## 2019-02-23 DIAGNOSIS — H9190 Unspecified hearing loss, unspecified ear: Secondary | ICD-10-CM | POA: Insufficient documentation

## 2019-02-23 NOTE — Progress Notes (Signed)
Virtual Visit via Video Note  I connected with@ on 02/23/19 at  9:30 AM EDT by a video enabled telemedicine application and verified that I am speaking with the correct person using two identifiers.  Location patient: home Location provider:work or home office Persons participating in the virtual visit: patient, provider  I discussed the limitations of evaluation and management by telemedicine and the availability of in person appointments. The patient expressed understanding and agreed to proceed.   Brian Lowery DOB: 1966/06/22 Encounter date: 02/23/2019  This is a 53 y.o. male who presents to establish care. Chief Complaint  Patient presents with  . Transitions Of Care   Previous patient of Dr. [name] (last visit was 11/2016)  History of present illness: Visit expedited due to elevated PSA in past; but had jump on recent bloodwork at work. Jumped from 2.3 to 2.9 (February 2019 to 2020). Other bloodwork was "stable" (see lab table as this has been entered from being viewed on screen shot during webex).   Usually runs or goes to gym daily; does a lot of trail riding. Feels healthy.    Allergies: Had allergies from childhood. Took claritin regularly and would get eye swelling, severe congestion, sleeping sitting up at night. As he has aged it is improving. Even did allergy shots as child. Taking zyrtec right now intermittently and this is working well for him. Keeps N95 mask on to mow.   bloodwork through employer: most recently done in January 2020.  Colonoscopy 01/2017:single tubular adenoma.  Past Medical History:  Diagnosis Date  . Allergy    Past Surgical History:  Procedure Laterality Date  . APPENDECTOMY  1995  . KNEE SURGERY Left    11 years ago. Meniscal tear repair arthroscopic.  Marland Kitchen VASECTOMY  2017   No Known Allergies Current Meds  Medication Sig  . cetirizine (ZYRTEC) 10 MG tablet Take 10 mg by mouth as needed for allergies.  . fluticasone (FLONASE) 50 MCG/ACT  nasal spray Place 1 spray into both nostrils daily.  . [DISCONTINUED] montelukast (SINGULAIR) 10 MG tablet Take 1 tablet (10 mg total) by mouth at bedtime.   Current Facility-Administered Medications for the 02/23/19 encounter (Office Visit) with Caren Macadam, MD  Medication  . 0.9 %  sodium chloride infusion   Social History   Tobacco Use  . Smoking status: Never Smoker  . Smokeless tobacco: Never Used  Substance Use Topics  . Alcohol use: Yes    Comment: ocassionally; weekends   Family History  Problem Relation Age of Onset  . Hyperlipidemia Mother   . Breast cancer Mother   . Hypertension Father   . Atrial fibrillation Father   . Heart disease Paternal Grandfather 65       sudden cardiac death without history  . Stroke Maternal Grandfather 37  . High Cholesterol Maternal Grandfather   . Other Maternal Grandfather        smoker  . Dementia Maternal Grandmother 3  . Other Paternal Grandmother        no known medical problems 90+  . Colon cancer Neg Hx      Review of Systems  Constitutional: Negative for chills, fatigue and fever.  Respiratory: Negative for cough, chest tightness, shortness of breath and wheezing.   Cardiovascular: Negative for chest pain, palpitations and leg swelling.  Genitourinary: Negative for decreased urine volume, difficulty urinating and frequency.    Objective:  There were no vitals taken for this visit.      BP Readings  from Last 3 Encounters:  02/12/17 103/74  12/03/16 122/80  07/03/16 118/82   Wt Readings from Last 3 Encounters:  02/12/17 178 lb (80.7 kg)  01/12/17 178 lb (80.7 kg)  12/03/16 183 lb 9.6 oz (83.3 kg)    EXAM:  GENERAL: alert, oriented, appears well and in no acute distress  HEENT: atraumatic, conjunctiva clear, no obvious abnormalities on inspection of external nose and ears  NECK: normal movements of the head and neck  LUNGS: on inspection no signs of respiratory distress, breathing rate appears  normal, no obvious gross SOB, gasping or wheezing  CV: no obvious cyanosis  MS: moves all visible extremities without noticeable abnormality  PSYCH/NEURO: pleasant and cooperative, no obvious depression or anxiety, speech and thought processing grossly intact  Assessment/Plan 1. Hyperlipidemia, unspecified hyperlipidemia type Discussed monitoring cholesterol closely and possible need for statin as age elevates to help with risk factor control. Given family history could consider coronary calcium as well.  - TSH; Future  2. 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   3. Elevated PSA Will recheck in 3 months time; encouraged no ejaculation prior to bloodwork.  - PSA, Total and Free; Future   He does see derm regularly; asked him to have them forward note to [place] at next visit.  I discussed the assessment and treatment plan with the patient. The patient was provided an opportunity to ask questions and all were answered. The patient agreed with the plan and demonstrated an understanding of the instructions.   The patient was advised to call back or seek an in-person evaluation if the symptoms worsen or if the condition fails to improve as anticipated.   Micheline Rough, MD

## 2019-04-29 ENCOUNTER — Other Ambulatory Visit (INDEPENDENT_AMBULATORY_CARE_PROVIDER_SITE_OTHER): Payer: 59

## 2019-04-29 ENCOUNTER — Other Ambulatory Visit: Payer: Self-pay

## 2019-04-29 DIAGNOSIS — E785 Hyperlipidemia, unspecified: Secondary | ICD-10-CM | POA: Diagnosis not present

## 2019-04-29 DIAGNOSIS — R972 Elevated prostate specific antigen [PSA]: Secondary | ICD-10-CM | POA: Diagnosis not present

## 2019-04-29 LAB — TSH: TSH: 1.89 u[IU]/mL (ref 0.35–4.50)

## 2019-05-02 LAB — PSA, TOTAL AND FREE
PSA, % Free: 20 % (calc) — ABNORMAL LOW (ref >25)
PSA, Free: 0.5 ng/mL
PSA, Total: 2.5 ng/mL (ref <=4.0)

## 2021-02-06 ENCOUNTER — Other Ambulatory Visit: Payer: Self-pay

## 2021-02-06 ENCOUNTER — Ambulatory Visit
Admission: RE | Admit: 2021-02-06 | Discharge: 2021-02-06 | Disposition: A | Discharge status: Home / Self Care | Payer: 59 | Source: Ambulatory Visit | Attending: Nurse Practitioner | Admitting: Nurse Practitioner

## 2021-02-06 ENCOUNTER — Other Ambulatory Visit: Payer: Self-pay | Admitting: Nurse Practitioner

## 2021-02-06 DIAGNOSIS — Z0289 Encounter for other administrative examinations: Secondary | ICD-10-CM

## 2022-02-10 ENCOUNTER — Encounter: Payer: Self-pay | Admitting: Gastroenterology

## 2022-09-17 ENCOUNTER — Encounter: Payer: Self-pay | Admitting: Family Medicine

## 2022-09-17 ENCOUNTER — Ambulatory Visit (INDEPENDENT_AMBULATORY_CARE_PROVIDER_SITE_OTHER): Payer: 59 | Admitting: Family Medicine

## 2022-09-17 VITALS — BP 132/90 | HR 65 | Temp 97.7°F | Ht 70.0 in | Wt 181.8 lb

## 2022-09-17 DIAGNOSIS — Z23 Encounter for immunization: Secondary | ICD-10-CM

## 2022-09-17 DIAGNOSIS — R972 Elevated prostate specific antigen [PSA]: Secondary | ICD-10-CM | POA: Insufficient documentation

## 2022-09-17 DIAGNOSIS — E782 Mixed hyperlipidemia: Secondary | ICD-10-CM

## 2022-09-17 DIAGNOSIS — M7631 Iliotibial band syndrome, right leg: Secondary | ICD-10-CM | POA: Diagnosis not present

## 2022-09-17 LAB — LIPID PANEL
Cholesterol: 233 mg/dL — ABNORMAL HIGH (ref 0–200)
HDL: 73.7 mg/dL (ref >39.00)
LDL Cholesterol: 144 mg/dL — ABNORMAL HIGH (ref 0–99)
NonHDL: 159.42
Total CHOL/HDL Ratio: 3
Triglycerides: 75 mg/dL (ref 0.0–149.0)
VLDL: 15 mg/dL (ref 0.0–40.0)

## 2022-09-17 LAB — COMPREHENSIVE METABOLIC PANEL
ALT: 18 U/L (ref 0–53)
AST: 18 U/L (ref 0–37)
Albumin: 4.6 g/dL (ref 3.5–5.2)
Alkaline Phosphatase: 62 U/L (ref 39–117)
BUN: 13 mg/dL (ref 6–23)
CO2: 28 mEq/L (ref 19–32)
Calcium: 9.7 mg/dL (ref 8.4–10.5)
Chloride: 103 mEq/L (ref 96–112)
Creatinine, Ser: 0.8 mg/dL (ref 0.40–1.50)
GFR: 99.16 mL/min (ref >60.00)
Glucose, Bld: 96 mg/dL (ref 70–99)
Potassium: 4.4 mEq/L (ref 3.5–5.1)
Sodium: 138 mEq/L (ref 135–145)
Total Bilirubin: 2 mg/dL — ABNORMAL HIGH (ref 0.2–1.2)
Total Protein: 6.5 g/dL (ref 6.0–8.3)

## 2022-09-17 LAB — PSA: PSA: 3.82 ng/mL (ref 0.10–4.00)

## 2022-09-17 NOTE — Assessment & Plan Note (Signed)
Last level in 2020 was 2.5, will continue to check this yearly, labs ordered today

## 2022-09-17 NOTE — Assessment & Plan Note (Addendum)
Reviewed last lipid panel from 2020, will obtain new lipid panel today, not on medication for this, he is using diet changes and exercise to control the cholesterol curently, will continue this current plan.

## 2022-09-17 NOTE — Progress Notes (Signed)
New Patient Office Visit  Subjective    Patient ID: Brian Lowery, male    DOB: 03/18/1966  Age: 56 y.o. MRN: [id number]  CC:  Chief Complaint  Patient presents with   Establish Care    HPI Brian Lowery presents to establish care Patient was last seen by our office 3 years ago. Pt reports that he was a former Dr. [name] patient, states that he does not have any health problems at this time. States that he was seeing Dr. Paulla Fore for IT band syndrome, states that he started magnesium about 2 weeks now, states that it helping.   Patient has a history of elevated cholesterol, states he has a family history of elevated cholesterol as well. States that he is trying to watch his diet and he exercises regularly. He denies any SOB, no chest pain at this time.  Hx elevated PSA-- pt reports positive family history of breast and prostate cancer (mother and brother respectively). He reports he has had elevated PSA in the past. He denies any urinary symptoms at this time, no hesitancy, no increased frequency, etc.  I reviewed his health maintenance, his last colonoscopy was in 2018 and only showed 1 small tubular adenoma, he does not need repeat scope at this time. He is agreeable to getting his shingrix vaccination series.   Outpatient Encounter Medications as of 09/17/2022  Medication Sig   cetirizine (ZYRTEC) 10 MG tablet Take 10 mg by mouth as needed for allergies.   MAGNESIUM PO Take by mouth 2 (two) times daily.   [DISCONTINUED] fluticasone (FLONASE) 50 MCG/ACT nasal spray Place 1 spray into both nostrils daily.   Facility-Administered Encounter Medications as of 09/17/2022  Medication   0.9 %  sodium chloride infusion    Past Medical History:  Diagnosis Date   Allergy    History of elevated PSA    IT band syndrome     Past Surgical History:  Procedure Laterality Date   APPENDECTOMY  1995   KNEE SURGERY Left    11 years ago. Meniscal tear repair arthroscopic.   VASECTOMY   2017    Family History  Problem Relation Age of Onset   Hyperlipidemia Mother    Breast cancer Mother    Hypertension Father    Atrial fibrillation Father    Prostate cancer Brother    Dementia Maternal Grandmother 34   Stroke Maternal Grandfather 53   High Cholesterol Maternal Grandfather    Other Maternal Grandfather        smoker   Other Paternal Grandmother        no known medical problems 90+   Heart disease Paternal Grandfather 55       sudden cardiac death without history   Colon cancer Neg Hx     Social History   Socioeconomic History   Marital status: Married    Spouse name: Not on file   Number of children: Not on file   Years of education: Not on file   Highest education level: Not on file  Occupational History   Not on file  Tobacco Use   Smoking status: Never   Smokeless tobacco: Never  Vaping Use   Vaping Use: Never used  Substance and Sexual Activity   Alcohol use: Yes    Comment: ocassionally; weekends   Drug use: No   Sexual activity: Not Currently  Other Topics Concern   Not on file  Social History Narrative   Not on file   Social  Determinants of Health   Financial Resource Strain: Not on file  Food Insecurity: Not on file  Transportation Needs: Not on file  Physical Activity: Not on file  Stress: Not on file  Social Connections: Not on file  Intimate Partner Violence: Not on file    Review of Systems  All other systems reviewed and are negative.       Objective    BP (!) 132/90 (BP Location: Left Arm, Patient Position: Sitting, Cuff Size: Normal)   Pulse 65   Temp 97.7 F (36.5 C) (Oral)   Ht '5\' 10"'$  (1.778 m)   Wt 181 lb 12.8 oz (82.5 kg)   SpO2 98%   BMI 26.09 kg/m   Physical Exam Vitals reviewed.  Constitutional:      Appearance: Normal appearance. He is well-groomed and normal weight.  Eyes:     Extraocular Movements: Extraocular movements intact.     Conjunctiva/sclera: Conjunctivae normal.  Neck:     Thyroid:  No thyromegaly.  Cardiovascular:     Rate and Rhythm: Normal rate and regular rhythm.     Heart sounds: S1 normal and S2 normal. No murmur heard. Pulmonary:     Effort: Pulmonary effort is normal.     Breath sounds: Normal breath sounds and air entry. No rales.  Abdominal:     General: Abdomen is flat. Bowel sounds are normal.  Musculoskeletal:     Right lower leg: No edema.     Left lower leg: No edema.  Neurological:     General: No focal deficit present.     Mental Status: He is alert and oriented to person, place, and time.     Gait: Gait is intact.  Psychiatric:        Mood and Affect: Mood and affect normal.         Assessment & Plan:   Problem List Items Addressed This Visit       Musculoskeletal and Integument   Iliotibial band syndrome affecting lower leg, right (Chronic)    Chronic, seeing Dr. Ileene Rubens for this, currently taking magnesium supplements to help with the muscle tightness. We discussed using NSAIDS PRN to help reduce the inflammation as well. Will continue to follow as needed        Other   Hyperlipidemia - Primary    Reviewed last lipid panel from 2020, will obtain new lipid panel today, not on medication for this, he is using diet changes and exercise to control the cholesterol curently, will continue this current plan.      Relevant Orders   Lipid Panel   CMP   Elevated PSA (Chronic)    Last level in 2020 was 2.5, will continue to check this yearly, labs ordered today      Relevant Orders   PSA   Other Visit Diagnoses     Need for shingles vaccine       Relevant Orders   Zoster Recombinant (Shingrix ) (Completed)       Return in about 1 year (around 09/18/2023) for Annual Physical Exam.   Farrel Conners, MD

## 2022-09-17 NOTE — Assessment & Plan Note (Signed)
Chronic, seeing Dr. Ileene Rubens for this, currently taking magnesium supplements to help with the muscle tightness. We discussed using NSAIDS PRN to help reduce the inflammation as well. Will continue to follow as needed

## 2022-09-23 NOTE — Addendum Note (Signed)
Addended by: Farrel Conners on: 09/23/2022 12:26 PM   Modules accepted: Orders

## 2022-10-27 ENCOUNTER — Other Ambulatory Visit: Payer: Self-pay | Admitting: Urology

## 2022-10-27 DIAGNOSIS — N402 Nodular prostate without lower urinary tract symptoms: Secondary | ICD-10-CM

## 2022-10-27 DIAGNOSIS — R972 Elevated prostate specific antigen [PSA]: Secondary | ICD-10-CM

## 2022-11-23 ENCOUNTER — Ambulatory Visit
Admission: RE | Admit: 2022-11-23 | Discharge: 2022-11-23 | Disposition: A | Discharge status: Home / Self Care | Payer: 59 | Source: Ambulatory Visit | Attending: Urology | Admitting: Urology

## 2022-11-23 DIAGNOSIS — R972 Elevated prostate specific antigen [PSA]: Secondary | ICD-10-CM

## 2022-11-23 DIAGNOSIS — N402 Nodular prostate without lower urinary tract symptoms: Secondary | ICD-10-CM

## 2022-11-23 MED ORDER — GADOPICLENOL 0.5 MMOL/ML IV SOLN
8.0000 mL | Freq: Once | INTRAVENOUS | Status: AC | PRN
  Administered 2022-11-23: 8 mL via INTRAVENOUS

## 2022-12-09 ENCOUNTER — Encounter: Payer: Self-pay | Admitting: Family Medicine

## 2022-12-09 ENCOUNTER — Ambulatory Visit: Payer: 59 | Admitting: Family Medicine

## 2022-12-09 VITALS — BP 124/88 | HR 85 | Temp 98.6°F | Ht 70.0 in | Wt 178.0 lb

## 2022-12-09 DIAGNOSIS — F4323 Adjustment disorder with mixed anxiety and depressed mood: Secondary | ICD-10-CM

## 2022-12-09 DIAGNOSIS — Z23 Encounter for immunization: Secondary | ICD-10-CM

## 2022-12-09 MED ORDER — CITALOPRAM HYDROBROMIDE 20 MG PO TABS: 20.0000 mg | ORAL_TABLET | Freq: Every day | ORAL | 3 refills | Status: DC

## 2022-12-09 NOTE — Progress Notes (Signed)
Established Patient Office Visit  Subjective   Patient ID: Brian Lowery, male    DOB: October 25, 1966  Age: 57 y.o. MRN: [id number]  Chief Complaint  Patient presents with   Anxiety    Patient complains of anxiety and depression since separation 10 days ago   Depression    Pt is here today to discuss his emotional distress. States that he has been married for 20 years and he is recently separated from his wife. States that he and his wife had decided to have this trial period apart to try and "work on themselves".  Pt reports that this has been several years in the making, states he retired a few years ago to try and work on his relationships. States that he is trying to get better, states that he is having a lot of emotions, feeling epty, difficulty focusing on his daily tasks. Pt states he has already been seeing a therapist, initially they were going as a couple and now individually. States that he thinks they both want it to work and to get back together.   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 He describes himself as a "type A" person who is looking for "the next thing he has to do". Pt states he is able to complete his ADL's, states that he is sometimes having trouble sleeping, states that he is having nightttime awakenings more.    [place] Visit from 12/09/2022 in [place] at [facility]  PHQ-9 Total Score 14       Current Outpatient Medications  Medication Instructions   cetirizine (ZYRTEC) 10 mg, Oral, As needed   citalopram (CELEXA) 20 mg, Oral, Daily at bedtime, Stat with 1/2 tablet daily at bedtime for 2 weeks, then increase to 1 tablet at bedtime.   MAGNESIUM PO Oral, 2 times daily     Patient Active Problem List   Diagnosis Date Noted   Adjustment disorder with mixed anxiety and depressed mood 12/09/2022   Iliotibial  band syndrome affecting lower leg, right 09/17/2022   Elevated PSA 09/17/2022   Hyperlipidemia 02/23/2019   High frequency hearing loss 02/23/2019   Allergic rhinitis 12/29/2013      Review of Systems  All other systems reviewed and are negative.     Objective:     BP 124/88 (BP Location: Left Arm, Patient Position: Sitting, Cuff Size: Normal)   Pulse 85   Temp 98.6 F (37 C) (Oral)   Ht '5\' 10"'$  (1.778 m)   Wt 178 lb (80.7 kg)   SpO2 98%   BMI 25.54 kg/m    Physical Exam Vitals reviewed.  Constitutional:      Appearance: Normal appearance. He is well-groomed and normal weight.  Cardiovascular:     Rate and Rhythm: Normal rate and regular rhythm.     Heart sounds: S1 normal and S2 normal. No murmur heard. Pulmonary:     Effort: Pulmonary effort is normal.     Breath sounds: Normal breath sounds and air entry. No rales.  Neurological:     General: No focal deficit present.     Mental Status: He is alert and oriented to person, place, and time.     Gait: Gait is intact.  Psychiatric:        Mood and Affect: Affect normal. Mood is depressed.  Behavior: Behavior normal.      No results found for any visits on 12/09/22.    The 10-year ASCVD risk score (Arnett DK, et al., 2019) is: 4.9%    Assessment & Plan:   Problem List Items Addressed This Visit       Unprioritized   Adjustment disorder with mixed anxiety and depressed mood - Primary    New diagnosis, secondary to marital issues. We had a long discussion about medications that could be started to help, I advised we start citalopram starting with 10 mg daily at bedtime then increasing to 20 mg daily at bedtime due to the sleep disturbance he is experiencing. We reviewed the potential side effects as well, I will follow up with him via video visit in 4 weeks to re-evaluate his symptoms.       Relevant Medications   citalopram (CELEXA) 20 MG tablet   Other Visit Diagnoses     Immunization due        Relevant Orders   Zoster Recombinant (Shingrix )       Return in about 4 weeks (around 01/06/2023) for video visit to follow up on depression.    Farrel Conners, MD

## 2022-12-09 NOTE — Assessment & Plan Note (Signed)
New diagnosis, secondary to marital issues. We had a long discussion about medications that could be started to help, I advised we start citalopram starting with 10 mg daily at bedtime then increasing to 20 mg daily at bedtime due to the sleep disturbance he is experiencing. We reviewed the potential side effects as well, I will follow up with him via video visit in 4 weeks to re-evaluate his symptoms.

## 2022-12-22 ENCOUNTER — Encounter: Payer: Self-pay | Admitting: Family Medicine

## 2022-12-23 ENCOUNTER — Telehealth: Payer: Self-pay | Admitting: Licensed Clinical Social Worker

## 2022-12-23 NOTE — Telephone Encounter (Signed)
Scheduled appt per 1/30 referral. Pt is aware of appt date and time. Pt is aware to arrive 15 mins prior to appt time and to bring and updated insurance card. Pt is aware of appt location.

## 2023-01-08 ENCOUNTER — Telehealth: Payer: 59 | Admitting: Family Medicine

## 2023-01-08 ENCOUNTER — Encounter: Payer: Self-pay | Admitting: Family Medicine

## 2023-01-08 DIAGNOSIS — F4323 Adjustment disorder with mixed anxiety and depressed mood: Secondary | ICD-10-CM

## 2023-01-08 MED ORDER — CITALOPRAM HYDROBROMIDE 10 MG PO TABS: 10.0000 mg | ORAL_TABLET | Freq: Every day | ORAL | 0 refills | Status: DC

## 2023-01-08 NOTE — Progress Notes (Signed)
   Virtual Medical Office Visit  Patient:  Brian Lowery      Age: 57 y.o.       Sex:  male  Date:   01/08/2023  PCP:    Farrel Conners, MD   [place] Provider: Farrel Conners, MD    Assessment/Plan:   Summary assessment:  [name] was seen today for medical management of chronic issues.  Adjustment disorder with mixed anxiety and depressed mood Assessment & Plan: Patient is feeling better, more relaxed and calm, his affect has improved objectively since the last visit. Will continue the current dose of 10 mg daily of citalopram, will follow up this summer.  Orders: -     Citalopram Hydrobromide; Take 1 tablet (10 mg total) by mouth daily.  Dispense: 90 tablet; Refill: 0     Return in about 5 months (around 06/08/2023) for follow up adjustment disorder-- ok to to schedule a video visit.Marland Kitchen   He was advised to call the office or go to ER if his condition worsens    Subjective:   Brian Lowery is a 57 y.o. male with PMH significant for: Past Medical History:  Diagnosis Date   Allergy    History of elevated PSA    IT band syndrome      Presenting today with: Chief Complaint  Patient presents with   Medical Management of Chronic Issues    Patient states he started taking Citalopram '10mg'$  instead of '20mg'$  and around 10am-2pm instead of at bedtime due to waking up with a dry mouth     He clarifies and reports that his condition: Patient has continued the 10 mg daily dosage, states he was getting dry mouth and a little headache initially, a slight loss of libido. States that he feels that the 10 mg is working for him, would like to stay on this dose.   He denies having any: Sleepiness or grogginess on the medication, states that he does feel able to sit still for longer periods,           Objective/Observations  Physical Exam:  Polite and friendly Gen: NAD, resting comfortably Pulm: Normal work of breathing Neuro: Grossly normal, moves all  extremities Psych: Normal affect and thought content, patient is smiling today, much more animated, affect is pleasant  No images are attached to the encounter or orders placed in the encounter.    Results: No results found for any visits on 01/08/23.   No results found for this or any previous visit (from the past 2160 hour(s)).         Virtual Visit via Video   I connected with Brian Lowery on 01/08/23 at  8:00 AM EST by a video enabled telemedicine application and verified that I am speaking with the correct person using two identifiers. The limitations of evaluation and management by telemedicine and the availability of in person appointments were discussed. The patient expressed understanding and agreed to proceed.  I spent a totally of 20 minutes in the face to face encounter today discussing side effects, improvements, etc.  Percentage of appointment time on video:  95% Patient location: Home Provider location: [facility] Persons participating in the virtual visit: Myself and Patient

## 2023-01-08 NOTE — Assessment & Plan Note (Signed)
Patient is feeling better, more relaxed and calm, his affect has improved objectively since the last visit. Will continue the current dose of 10 mg daily of citalopram, will follow up this summer.

## 2023-01-27 IMAGING — CR DG CHEST 2V
2 series · 2 of 2 positions shown · non-contrast
Comparison: None.

CLINICAL DATA: Physical exam for exit from fire department

EXAM:
CHEST - 2 VIEW

[w chest pa]
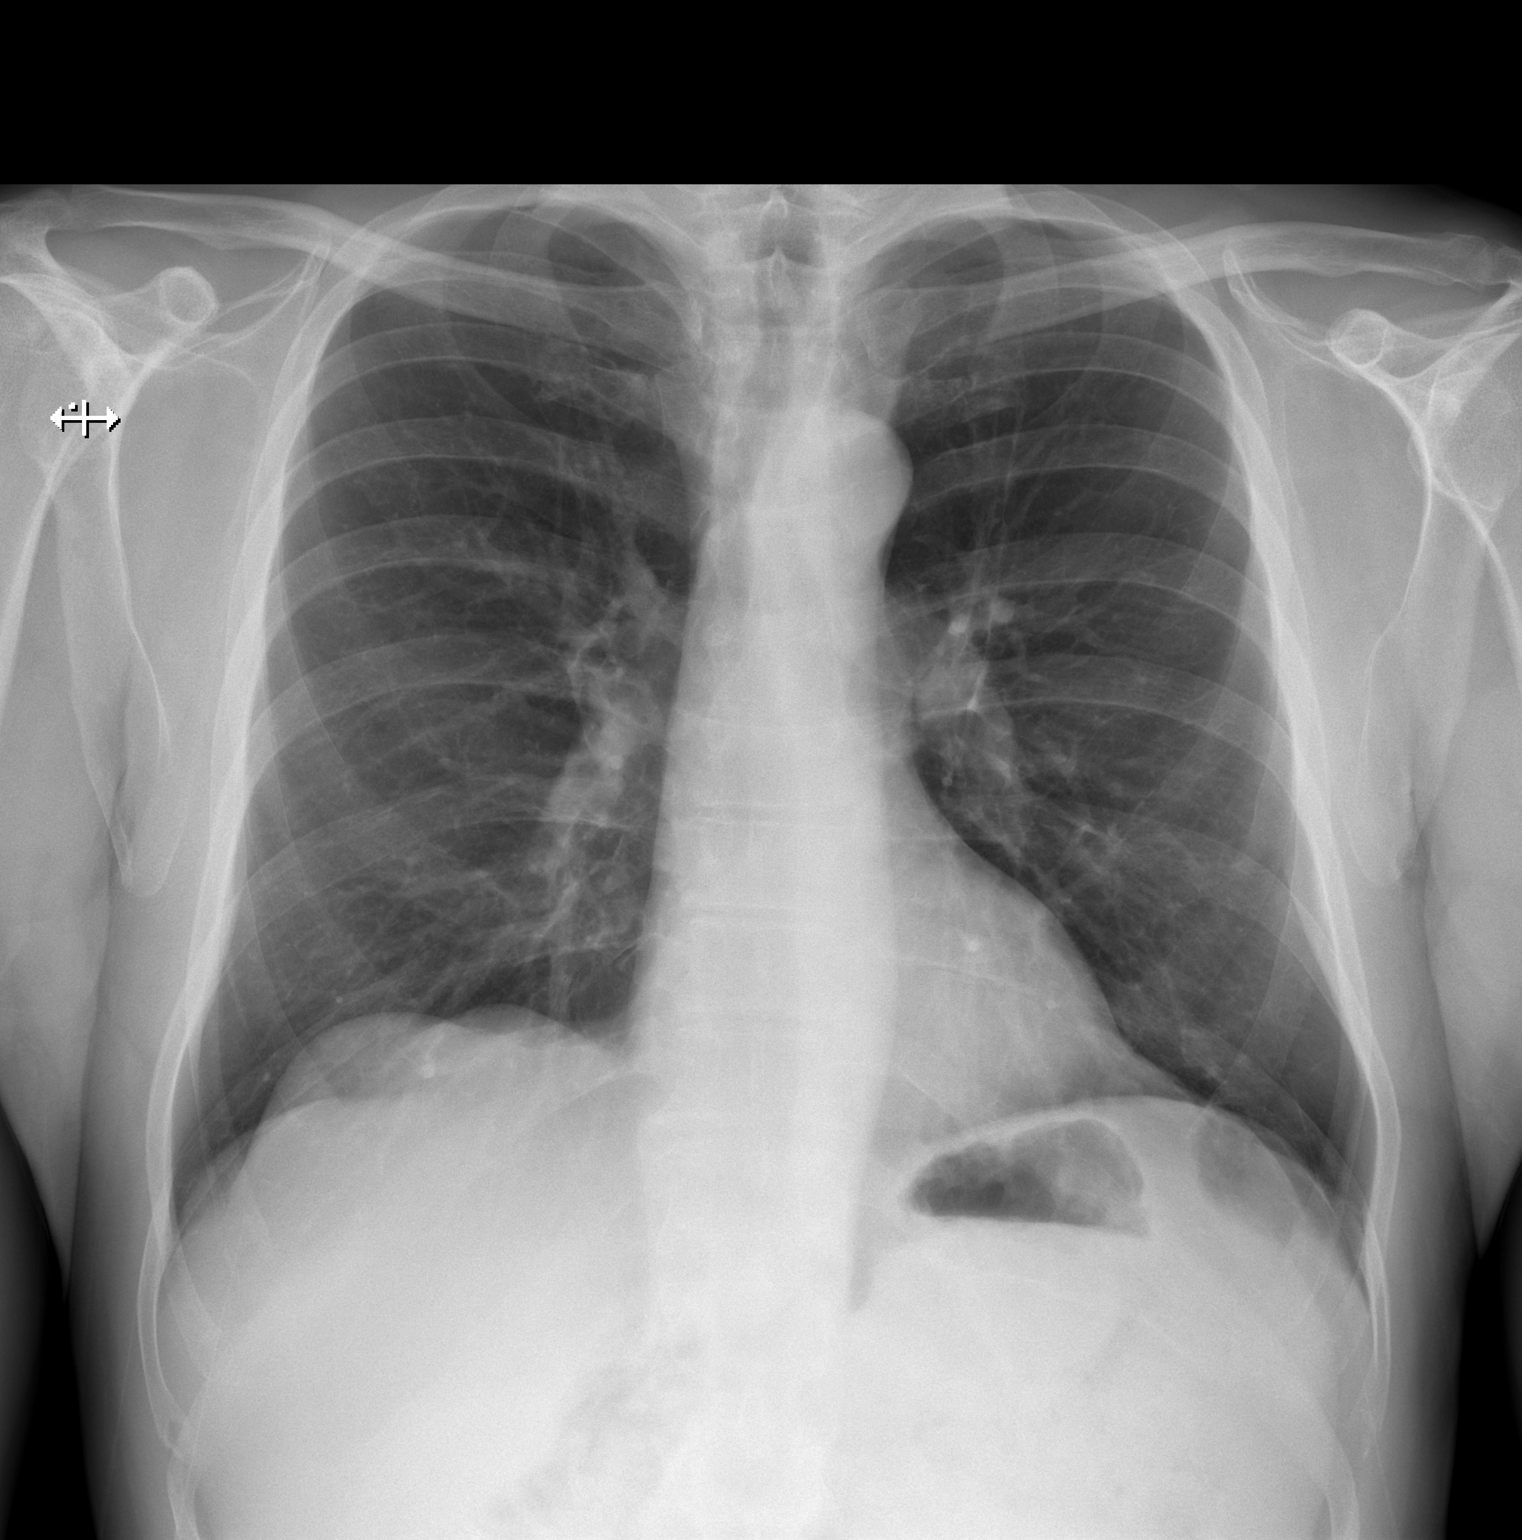

[w chest lat]
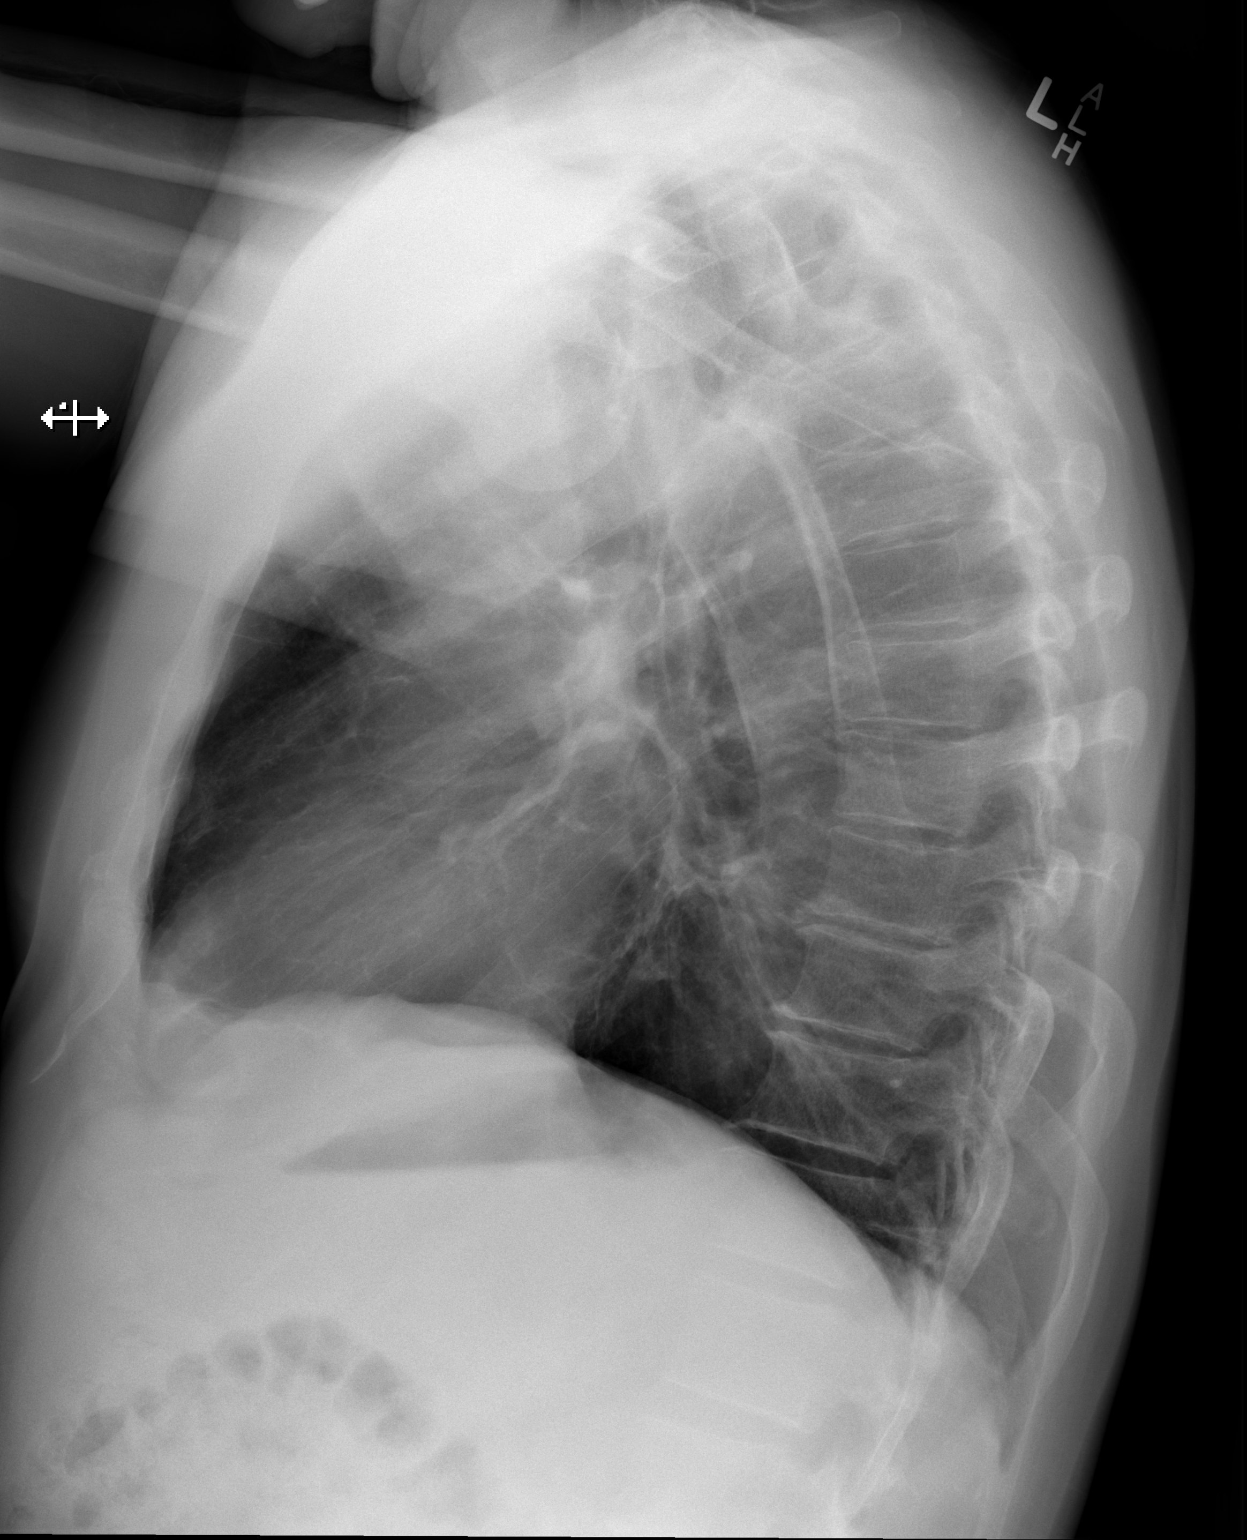

[2 of 2 positions shown; findings below may reference images not displayed]

FINDINGS: The heart size and mediastinal contours are within normal limits.
Both lungs are clear. The visualized skeletal structures are
unremarkable.
IMPRESSION: No active cardiopulmonary disease.

## 2023-02-09 ENCOUNTER — Inpatient Hospital Stay: Payer: 59 | Admitting: Licensed Clinical Social Worker

## 2023-02-09 ENCOUNTER — Encounter: Payer: Self-pay | Admitting: Licensed Clinical Social Worker

## 2023-02-09 ENCOUNTER — Inpatient Hospital Stay: Payer: 59

## 2023-02-09 ENCOUNTER — Other Ambulatory Visit: Payer: Self-pay | Admitting: Licensed Clinical Social Worker

## 2023-02-09 DIAGNOSIS — Z803 Family history of malignant neoplasm of breast: Secondary | ICD-10-CM | POA: Diagnosis not present

## 2023-02-09 DIAGNOSIS — Z8042 Family history of malignant neoplasm of prostate: Secondary | ICD-10-CM

## 2023-02-09 DIAGNOSIS — C61 Malignant neoplasm of prostate: Secondary | ICD-10-CM

## 2023-02-09 LAB — GENETIC SCREENING ORDER

## 2023-02-09 NOTE — Progress Notes (Signed)
REFERRING PROVIDER: Lucas Mallow, MD [place],  [place] [address]  PRIMARY PROVIDER:  Farrel Conners, MD  PRIMARY REASON FOR VISIT:  1. Prostate cancer ([place])   2. Family history of breast cancer   3. Family history of prostate cancer      HISTORY OF PRESENT ILLNESS:   [name], a 57 y.o. male, was seen for a [facility] cancer genetics consultation at the request of Dr. Gloriann Loan due to a personal and family history of prostate cancer.  Brian Lowery presents to clinic today to discuss the possibility of a hereditary predisposition to cancer, genetic testing, and to further clarify his future cancer risks, as well as potential cancer risks for family members.   CANCER HISTORY:  In 2024, at the age of 57, Brian Lowery was diagnosed with prostate cancer. 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 Treatment plan currently includes active surveillance.  He reports having a few polyps on colonoscopy several years ago. He does see dermatologist for skin checks.  Past Medical History:  Diagnosis Date   Allergy    History of elevated PSA    IT band syndrome     Past Surgical History:  Procedure Laterality Date   APPENDECTOMY  1995   KNEE SURGERY Left    11 years ago. Meniscal tear repair arthroscopic.   VASECTOMY  2017    FAMILY HISTORY:  We obtained a detailed, 4-generation family history.  Significant diagnoses are listed below: Family History  Problem Relation Age of Onset   Hyperlipidemia Mother    Breast cancer Mother 24       neg breast cancer STAT genetic testing  - 2017   Hypertension Father    Atrial fibrillation Father    Prostate cancer Brother 54   Dementia Maternal Grandmother 11   Stroke Maternal Grandfather 71   High Cholesterol Maternal Grandfather    Other Maternal Grandfather        smoker   Other Paternal Grandmother        no known medical problems 90+   Heart disease Paternal  Grandfather 50       sudden cardiac death without history   Colon cancer Neg Hx    Brian Lowery has 1 daughter, 1 son, no cancers. He has 1 brother who was diagnosed with prostate cancer a few years ago (approximately age 24) who is also on active surveillance.   Brian Lowery mother had breast cancer around age 26 and had negative 9 gene STAT panel from [facility] at the time (2017). No other known cancers on this side of the family.  Brian Lowery father is living at 41. A paternal great uncle had skin cancer. No other known cancers on this side of the family.  Brian Lowery is aware of previous family history of genetic testing for hereditary cancer risks. There is no reported Ashkenazi Jewish ancestry. There is no known consanguinity.    GENETIC COUNSELING ASSESSMENT: [name] is a 57 y.o. male with a personal and family history of prostate cancer which is somewhat suggestive of a hereditary cancer syndrome and predisposition to cancer. We, therefore, discussed and recommended the following at today's visit.   DISCUSSION: We discussed that approximately 10% of prostate cancer is hereditary. Most cases of hereditary prostate cancer are associated with BRCA1/BRCA2 genes, although there are other genes associated with hereditary prostate cancer as well including HOXB13. Cancers and risks are gene specific. We  discussed that testing is beneficial for several reasons including knowing about cancer risks, identifying potential screening and risk-reduction options that may be appropriate, and to understand if other family members could be at risk for cancer and allow them to undergo genetic testing.   We reviewed the characteristics, features and inheritance patterns of hereditary cancer syndromes. We also discussed genetic testing, including the appropriate family members to test, the process of testing, insurance coverage and turn-around-time for results. We discussed the implications of a negative,  positive and/or variant of uncertain significant result. We recommended Mr. Riedemann pursue genetic testing for the [facility] Multi-Cancer+RNA gene panel.   Based on Brian Lowery's personal and family history of cancer, he meets medical criteria for genetic testing. Despite that he meets criteria, he may still have an out of pocket cost. We discussed that if his out of pocket cost for testing is over $100, the laboratory will call and confirm whether he wants to proceed with testing.  If the out of pocket cost of testing is less than $100 he will be billed by the genetic testing laboratory.   PLAN: After considering the risks, benefits, and limitations, Brian Lowery provided informed consent to pursue genetic testing and the blood sample was sent to [facility] for analysis of the Multi-Cancer+RNA panel. Results should be available within approximately 2-3 weeks' time, at which point they will be disclosed by telephone to [name], as will any additional recommendations warranted by these results. Brian Lowery will receive a summary of his genetic counseling visit and a copy of his results once available. This information will also be available in Epic.   Brian Lowery questions were answered to his satisfaction today. Our contact information was provided should additional questions or concerns arise. Thank you for the referral and allowing [place] to share in the care of your patient.   Faith Rogue, MS, [facility] Genetic Counselor [email or web address] Phone: [phone]  The patient was seen for a total of 30 minutes in face-to-face genetic counseling.  Dr. Grayland Ormond was available for discussion regarding this case.   _______________________________________________________________________ For Office Staff:  Number of people involved in session: 1 Was an Intern/ student involved with case: no

## 2023-02-20 ENCOUNTER — Telehealth: Payer: Self-pay | Admitting: Licensed Clinical Social Worker

## 2023-02-23 ENCOUNTER — Ambulatory Visit: Payer: Self-pay | Admitting: Licensed Clinical Social Worker

## 2023-02-23 ENCOUNTER — Encounter: Payer: Self-pay | Admitting: Licensed Clinical Social Worker

## 2023-02-23 DIAGNOSIS — Z1379 Encounter for other screening for genetic and chromosomal anomalies: Secondary | ICD-10-CM | POA: Insufficient documentation

## 2023-02-23 NOTE — Progress Notes (Signed)
HPI:   [name] was previously seen in the [facility] clinic due to a personal and family history of cancer and concerns regarding a hereditary predisposition to cancer. Please refer to our prior cancer genetics clinic note for more information regarding our discussion, assessment and recommendations, at the time. [name] recent genetic test results were disclosed to him, as were recommendations warranted by these results. These results and recommendations are discussed in more detail below.  CANCER HISTORY:  Oncology History   No history exists.    FAMILY HISTORY:  We obtained a detailed, 4-generation family history.  Significant diagnoses are listed below: Family History  Problem Relation Age of Onset   Hyperlipidemia Mother    Breast cancer Mother 54       neg breast cancer STAT genetic testing  - 2017   Hypertension Father    Atrial fibrillation Father    Prostate cancer Brother 72   Dementia Maternal Grandmother 19   Stroke Maternal Grandfather 14   High Cholesterol Maternal Grandfather    Other Maternal Grandfather        smoker   Other Paternal Grandmother        no known medical problems 90+   Heart disease Paternal Grandfather 20       sudden cardiac death without history   Colon cancer Neg Hx     [name] has 1 daughter, 1 son, no cancers. He has 1 brother who was diagnosed with prostate cancer a few years ago (approximately age 2) who is also on active surveillance.    [name] mother had breast cancer around age 47 and had negative 9 gene STAT panel from [facility] at the time (2017). No other known cancers on this side of the family.   [name] father is living at 47. A paternal great uncle had skin cancer. No other known cancers on this side of the family.   [name] is aware of previous family history of genetic testing for hereditary cancer risks. There is no reported Ashkenazi Jewish ancestry. There is no known consanguinity.      GENETIC TEST RESULTS:  The [facility] Multi-Cancer+RNA Panel found no pathogenic mutations.  The Multi-Cancer + RNA Panel offered by [facility] includes sequencing and/or deletion/duplication analysis of the following 70 genes:  AIP*, ALK, APC*, ATM*, AXIN2*, BAP1*, BARD1*, BLM*, BMPR1A*, BRCA1*, BRCA2*, BRIP1*, CDC73*, CDH1*, CDK4, CDKN1B*, CDKN2A, CHEK2*, CTNNA1*, DICER1*, EPCAM, EGFR, FH*, FLCN*, GREM1, HOXB13, KIT, LZTR1, MAX*, MBD4, MEN1*, MET, MITF, MLH1*, MSH2*, MSH3*, MSH6*, MUTYH*, NF1*, NF2*, NTHL1*, PALB2*, PDGFRA, PMS2*, POLD1*, POLE*, POT1*, PRKAR1A*, PTCH1*, PTEN*, RAD51C*, RAD51D*, RB1*, RET, SDHA*, SDHAF2*, SDHB*, SDHC*, SDHD*, SMAD4*, SMARCA4*, SMARCB1*, SMARCE1*, STK11*, SUFU*, TMEM127*, TP53*, TSC1*, TSC2*, VHL*. RNA analysis is performed for * genes.   The test report has been scanned into EPIC and is located under the Molecular Pathology section of the Results Review tab.  A portion of the result report is included below for reference. Genetic testing reported out on 02/18/2023.     Even though a pathogenic variant was not identified, possible explanations for the cancer in the family may include: There may be no hereditary risk for cancer in the family. The cancers in [name] and/or his family may be sporadic/familial or due to other genetic and environmental factors. There may be a gene mutation in one of these genes that current testing methods cannot detect but that chance is small. There could be another gene that has not yet been discovered,  or that we have not yet tested, that is responsible for the cancer diagnoses in the family.  It is also possible there is a hereditary cause for the cancer in the family that [name] did not inherit. Therefore, it is important to remain in touch with cancer genetics in the future so that we can continue to offer [name] the most up to date genetic testing.   ADDITIONAL GENETIC TESTING:  We discussed with [name] that his  genetic testing was fairly extensive.  If there are additional relevant genes identified to increase cancer risk that can be analyzed in the future, we would be happy to discuss and coordinate this testing at that time.    CANCER SCREENING RECOMMENDATIONS:  [name] test result is considered negative (normal).  This means that we have not identified a hereditary cause for his personal and family history of cancer at this time.   An individual's cancer risk and medical management are not determined by genetic test results alone. Overall cancer risk assessment incorporates additional factors, including personal medical history, family history, and any available genetic information that may result in a personalized plan for cancer prevention and surveillance. Therefore, it is recommended he continue to follow the cancer management and screening guidelines provided by his oncology and primary healthcare provider.  RECOMMENDATIONS FOR FAMILY MEMBERS:   Since he did not inherit a identifiable mutation in a cancer predisposition gene included on this panel, his children could not have inherited a known mutation from him in one of these genes. Individuals in this family might be at some increased risk of developing cancer, over the general population risk, due to the family history of cancer.  Individuals in the family should notify their providers of the family history of cancer. We recommend women in this family have a yearly mammogram beginning at age 45, or 38 years younger than the earliest onset of cancer, an annual clinical breast exam, and perform monthly breast self-exams.  Family members should have colonoscopies by at age 40, or earlier, as recommended by their providers.  FOLLOW-UP:  Lastly, we discussed with [name] that cancer genetics is a rapidly advancing field and it is possible that new genetic tests will be appropriate for him and/or his family members in the future. We encouraged him  to remain in contact with cancer genetics on an annual basis so we can update his personal and family histories and let him know of advances in cancer genetics that may benefit this family.   Our contact number was provided. [name] questions were answered to his satisfaction, and he knows he is welcome to call us at anytime with additional questions or concerns.    Faith Rogue, MS, [facility] Genetic Counselor [email or web address] Phone: [phone]

## 2023-02-23 NOTE — Telephone Encounter (Signed)
I contacted Mr. Suares to discuss his genetic testing results. No pathogenic variants were identified in the 70 genes analyzed. Detailed clinic note to follow.   The test report has been scanned into EPIC and is located under the Molecular Pathology section of the Results Review tab.  A portion of the result report is included below for reference.      Faith Rogue, MS, [facility] Genetic Counselor [email or web address] Phone: [phone]

## 2023-04-14 ENCOUNTER — Other Ambulatory Visit: Payer: Self-pay | Admitting: Family Medicine

## 2023-04-14 DIAGNOSIS — F4323 Adjustment disorder with mixed anxiety and depressed mood: Secondary | ICD-10-CM

## 2023-07-12 ENCOUNTER — Other Ambulatory Visit: Payer: Self-pay | Admitting: Family Medicine

## 2023-07-12 DIAGNOSIS — F4323 Adjustment disorder with mixed anxiety and depressed mood: Secondary | ICD-10-CM

## 2023-08-01 ENCOUNTER — Other Ambulatory Visit: Payer: Self-pay | Admitting: Family Medicine

## 2023-08-01 DIAGNOSIS — F4323 Adjustment disorder with mixed anxiety and depressed mood: Secondary | ICD-10-CM

## 2023-08-03 MED ORDER — CITALOPRAM HYDROBROMIDE 10 MG PO TABS: ORAL_TABLET | ORAL | 0 refills | Status: DC

## 2023-09-22 ENCOUNTER — Encounter: Payer: 59 | Admitting: Family Medicine

## 2023-10-17 ENCOUNTER — Other Ambulatory Visit: Payer: Self-pay | Admitting: Family Medicine

## 2023-10-17 DIAGNOSIS — F4323 Adjustment disorder with mixed anxiety and depressed mood: Secondary | ICD-10-CM

## 2023-10-21 ENCOUNTER — Other Ambulatory Visit: Payer: Self-pay | Admitting: Urology

## 2023-10-21 DIAGNOSIS — C61 Malignant neoplasm of prostate: Secondary | ICD-10-CM

## 2023-10-30 ENCOUNTER — Encounter: Payer: Self-pay | Admitting: Family Medicine

## 2023-10-30 ENCOUNTER — Ambulatory Visit (INDEPENDENT_AMBULATORY_CARE_PROVIDER_SITE_OTHER): Payer: 59 | Admitting: Family Medicine

## 2023-10-30 VITALS — BP 134/84 | HR 67 | Temp 97.8°F | Ht 69.69 in | Wt 190.8 lb

## 2023-10-30 DIAGNOSIS — Z13220 Encounter for screening for lipoid disorders: Secondary | ICD-10-CM

## 2023-10-30 DIAGNOSIS — Z1211 Encounter for screening for malignant neoplasm of colon: Secondary | ICD-10-CM

## 2023-10-30 DIAGNOSIS — Z23 Encounter for immunization: Secondary | ICD-10-CM | POA: Diagnosis not present

## 2023-10-30 DIAGNOSIS — Z0000 Encounter for general adult medical examination without abnormal findings: Secondary | ICD-10-CM | POA: Diagnosis not present

## 2023-10-30 LAB — COMPREHENSIVE METABOLIC PANEL
ALT: 23 U/L (ref 0–53)
AST: 18 U/L (ref 0–37)
Albumin: 4.5 g/dL (ref 3.5–5.2)
Alkaline Phosphatase: 68 U/L (ref 39–117)
BUN: 12 mg/dL (ref 6–23)
CO2: 32 meq/L (ref 19–32)
Calcium: 9.4 mg/dL (ref 8.4–10.5)
Chloride: 102 meq/L (ref 96–112)
Creatinine, Ser: 0.81 mg/dL (ref 0.40–1.50)
GFR: 98.02 mL/min (ref >60.00)
Glucose, Bld: 94 mg/dL (ref 70–99)
Potassium: 4.6 meq/L (ref 3.5–5.1)
Sodium: 139 meq/L (ref 135–145)
Total Bilirubin: 1.2 mg/dL (ref 0.2–1.2)
Total Protein: 6.7 g/dL (ref 6.0–8.3)

## 2023-10-30 LAB — LIPID PANEL
Cholesterol: 242 mg/dL — ABNORMAL HIGH (ref 0–200)
HDL: 59.6 mg/dL (ref >39.00)
LDL Cholesterol: 151 mg/dL — ABNORMAL HIGH (ref 0–99)
NonHDL: 182.64
Total CHOL/HDL Ratio: 4
Triglycerides: 158 mg/dL — ABNORMAL HIGH (ref 0.0–149.0)
VLDL: 31.6 mg/dL (ref 0.0–40.0)

## 2023-10-30 NOTE — Patient Instructions (Signed)
Health Maintenance, Male Adopting a healthy lifestyle and getting preventive care are important in promoting health and wellness. Ask your health care provider about: The right schedule for you to have regular tests and exams. Things you can do on your own to prevent diseases and keep yourself healthy. What should I know about diet, weight, and exercise? Eat a healthy diet  Eat a diet that includes plenty of vegetables, fruits, low-fat dairy products, and lean protein. Do not eat a lot of foods that are high in solid fats, added sugars, or sodium. Maintain a healthy weight Body mass index (BMI) is a measurement that can be used to identify possible weight problems. It estimates body fat based on height and weight. Your health care provider can help determine your BMI and help you achieve or maintain a healthy weight. Get regular exercise Get regular exercise. This is one of the most important things you can do for your health. Most adults should: Exercise for at least 150 minutes each week. The exercise should increase your heart rate and make you sweat (moderate-intensity exercise). Do strengthening exercises at least twice a week. This is in addition to the moderate-intensity exercise. Spend less time sitting. Even light physical activity can be beneficial. Watch cholesterol and blood lipids Have your blood tested for lipids and cholesterol at 57 years of age, then have this test every 5 years. You may need to have your cholesterol levels checked more often if: Your lipid or cholesterol levels are high. You are older than 57 years of age. You are at high risk for heart disease. What should I know about cancer screening? Many types of cancers can be detected early and may often be prevented. Depending on your health history and family history, you may need to have cancer screening at various ages. This may include screening for: Colorectal cancer. Prostate cancer. Skin cancer. Lung  cancer. What should I know about heart disease, diabetes, and high blood pressure? Blood pressure and heart disease High blood pressure causes heart disease and increases the risk of stroke. This is more likely to develop in people who have high blood pressure readings or are overweight. Talk with your health care provider about your target blood pressure readings. Have your blood pressure checked: Every 3-5 years if you are 18-39 years of age. Every year if you are 40 years old or older. If you are between the ages of 65 and 75 and are a current or former smoker, ask your health care provider if you should have a one-time screening for abdominal aortic aneurysm (AAA). Diabetes Have regular diabetes screenings. This checks your fasting blood sugar level. Have the screening done: Once every three years after age 45 if you are at a normal weight and have a low risk for diabetes. More often and at a younger age if you are overweight or have a high risk for diabetes. What should I know about preventing infection? Hepatitis B If you have a higher risk for hepatitis B, you should be screened for this virus. Talk with your health care provider to find out if you are at risk for hepatitis B infection. Hepatitis C Blood testing is recommended for: Everyone born from 1945 through 1965. Anyone with known risk factors for hepatitis C. Sexually transmitted infections (STIs) You should be screened each year for STIs, including gonorrhea and chlamydia, if: You are sexually active and are younger than 57 years of age. You are older than 57 years of age and your   health care provider tells you that you are at risk for this type of infection. Your sexual activity has changed since you were last screened, and you are at increased risk for chlamydia or gonorrhea. Ask your health care provider if you are at risk. Ask your health care provider about whether you are at high risk for HIV. Your health care provider  may recommend a prescription medicine to help prevent HIV infection. If you choose to take medicine to prevent HIV, you should first get tested for HIV. You should then be tested every 3 months for as long as you are taking the medicine. Follow these instructions at home: Alcohol use Do not drink alcohol if your health care provider tells you not to drink. If you drink alcohol: Limit how much you have to 0-2 drinks a day. Know how much alcohol is in your drink. In the [place] one drink equals one 12 oz bottle of beer (355 mL), one 5 oz glass of wine (148 mL), or one 1 oz glass of hard liquor (44 mL). Lifestyle Do not use any products that contain nicotine or tobacco. These products include cigarettes, chewing tobacco, and vaping devices, such as e-cigarettes. If you need help quitting, ask your health care provider. Do not use street drugs. Do not share needles. Ask your health care provider for help if you need support or information about quitting drugs. General instructions Schedule regular health, dental, and eye exams. Stay current with your vaccines. Tell your health care provider if: You often feel depressed. You have ever been abused or do not feel safe at home. Summary Adopting a healthy lifestyle and getting preventive care are important in promoting health and wellness. Follow your health care provider's instructions about healthy diet, exercising, and getting tested or screened for diseases. Follow your health care provider's instructions on monitoring your cholesterol and blood pressure. This information is not intended to replace advice given to you by your health care provider. Make sure you discuss any questions you have with your health care provider. Document Revised: 04/01/2021 Document Reviewed: 04/01/2021 Elsevier Patient Education  2024 Elsevier Inc.  

## 2023-10-30 NOTE — Progress Notes (Signed)
Complete physical exam  Patient: Brian Lowery   DOB: 1966/09/17   57 y.o. Male  MRN: [id number]  Subjective:    Chief Complaint  Patient presents with   Annual Exam    Brian Lowery is a 57 y.o. male who presents today for a complete physical exam. He reports consuming a general diet. Gym/ health club routine includes bikes several miles per day. He generally feels well. He reports sleeping well. He does not have additional problems to discuss today.   Needs scheduled for colonoscopy March 2025.  Most recent fall risk assessment:     No data to display           Most recent depression screenings:    12/09/2022    1:23 PM 09/17/2022    8:58 AM  PHQ 2/9 Scores  PHQ - 2 Score 6 4  PHQ- 9 Score 14 6    Vision:Within last year and Dental: No current dental problems and Receives regular dental care  Patient Active Problem List   Diagnosis Date Noted   Genetic testing 02/23/2023   Adjustment disorder with mixed anxiety and depressed mood 12/09/2022   Iliotibial band syndrome affecting lower leg, right 09/17/2022   Elevated PSA 09/17/2022   Hyperlipidemia 02/23/2019   High frequency hearing loss 02/23/2019   Allergic rhinitis 12/29/2013      Patient Care Team: Karie Georges, MD as PCP - General (Family Medicine) Specialists, Dermatology (Dermatology)   Outpatient Medications Prior to Visit  Medication Sig   cetirizine (ZYRTEC) 10 MG tablet Take 10 mg by mouth as needed for allergies.   citalopram (CELEXA) 10 MG tablet TAKE 1 TABLET(10 MG) BY MOUTH DAILY   MAGNESIUM PO Take by mouth 2 (two) times daily.   [DISCONTINUED] 0.9 %  sodium chloride infusion    No facility-administered medications prior to visit.    Review of Systems  HENT:  Negative for hearing loss.   Eyes:  Negative for blurred vision.  Respiratory:  Negative for shortness of breath.   Cardiovascular:  Negative for chest pain.  Gastrointestinal: Negative.   Genitourinary: Negative.    Musculoskeletal:  Negative for back pain.  Neurological:  Negative for headaches.  Psychiatric/Behavioral:  Negative for depression.        Objective:     BP 134/84 (BP Location: Right Arm, Patient Position: Sitting, Cuff Size: Normal)   Pulse 67   Temp 97.8 F (36.6 C) (Oral)   Ht 5' 9.69" (1.77 m)   Wt 190 lb 12.8 oz (86.5 kg)   SpO2 97%   BMI 27.63 kg/m    Physical Exam Vitals reviewed.  Constitutional:      Appearance: Normal appearance. He is well-groomed and normal weight.  HENT:     Right Ear: Tympanic membrane and ear canal normal.     Left Ear: Tympanic membrane and ear canal normal.     Mouth/Throat:     Mouth: Mucous membranes are moist.     Pharynx: No posterior oropharyngeal erythema.  Eyes:     Extraocular Movements: Extraocular movements intact.     Conjunctiva/sclera: Conjunctivae normal.  Neck:     Thyroid: No thyromegaly.  Cardiovascular:     Rate and Rhythm: Normal rate and regular rhythm.     Heart sounds: S1 normal and S2 normal. No murmur heard. Pulmonary:     Effort: Pulmonary effort is normal.     Breath sounds: Normal breath sounds and air entry. No rales.  Abdominal:  General: Abdomen is flat. Bowel sounds are normal.  Musculoskeletal:     Right lower leg: No edema.     Left lower leg: No edema.  Lymphadenopathy:     Cervical: No cervical adenopathy.  Neurological:     General: No focal deficit present.     Mental Status: He is alert and oriented to person, place, and time.     Gait: Gait is intact.  Psychiatric:        Mood and Affect: Mood and affect normal.      No results found for any visits on 10/30/23.     Assessment & Plan:    Routine Health Maintenance and Physical Exam  Immunization History  Administered Date(s) Administered   Influenza-Unspecified 08/21/2022   Td 03/30/2005, 12/03/2016   Zoster Recombinant(Shingrix) 09/17/2022, 12/09/2022    Health Maintenance  Topic Date Due   COVID-19 Vaccine (1)  Never done   HIV Screening  Never done   Hepatitis C Screening  Never done   Colonoscopy  02/12/2022   INFLUENZA VACCINE  06/25/2023   DTaP/Tdap/Td (3 - Tdap) 12/03/2026   Zoster Vaccines- Shingrix  Completed   HPV VACCINES  Aged Out    Discussed health benefits of physical activity, and encouraged him to engage in regular exercise appropriate for his age and condition.  Colon cancer screening -     Ambulatory referral to Gastroenterology  Lipid screening -     Lipid panel  Routine general medical examination at a health care facility -     Comprehensive metabolic panel  Normal physical exam findings today, pt is due for his colonoscopy in March 2025. Orders placed for surveillance labs, counseled patient on healthy sleep habits and handouts given on healthy eating and exericse which he is already doing. RTC yearly for annual exams.   Return in 1 year (on 10/29/2024).     Karie Georges, MD

## 2023-12-10 ENCOUNTER — Ambulatory Visit
Admission: RE | Admit: 2023-12-10 | Discharge: 2023-12-10 | Disposition: A | Discharge status: Home / Self Care | Payer: 59 | Source: Ambulatory Visit | Attending: Urology | Admitting: Urology

## 2023-12-10 DIAGNOSIS — C61 Malignant neoplasm of prostate: Secondary | ICD-10-CM

## 2023-12-10 MED ORDER — GADOPICLENOL 0.5 MMOL/ML IV SOLN
7.5000 mL | Freq: Once | INTRAVENOUS | Status: AC | PRN
  Administered 2023-12-10: 7.5 mL via INTRAVENOUS

## 2024-01-18 ENCOUNTER — Other Ambulatory Visit: Payer: Self-pay | Admitting: Family Medicine

## 2024-01-18 DIAGNOSIS — F4323 Adjustment disorder with mixed anxiety and depressed mood: Secondary | ICD-10-CM

## 2024-01-21 ENCOUNTER — Encounter: Payer: Self-pay | Admitting: Gastroenterology

## 2024-02-02 ENCOUNTER — Encounter: Payer: Self-pay | Admitting: Gastroenterology

## 2024-03-04 ENCOUNTER — Ambulatory Visit (AMBULATORY_SURGERY_CENTER)

## 2024-03-04 VITALS — Ht 70.0 in | Wt 182.0 lb

## 2024-03-04 DIAGNOSIS — Z860100 Personal history of colon polyps, unspecified: Secondary | ICD-10-CM

## 2024-03-04 MED ORDER — NA SULFATE-K SULFATE-MG SULF 17.5-3.13-1.6 GM/177ML PO SOLN: 1.0000 | Freq: Once | ORAL | 0 refills | Status: AC

## 2024-03-04 NOTE — Progress Notes (Signed)

## 2024-03-17 ENCOUNTER — Encounter: Payer: Self-pay | Admitting: Gastroenterology

## 2024-03-21 ENCOUNTER — Encounter: Payer: Self-pay | Admitting: Certified Registered Nurse Anesthetist

## 2024-03-24 ENCOUNTER — Encounter: Payer: Self-pay | Admitting: Gastroenterology

## 2024-03-24 ENCOUNTER — Ambulatory Visit: Admitting: Gastroenterology

## 2024-03-24 VITALS — BP 102/68 | HR 54 | Temp 98.3°F | Resp 12 | Ht 70.0 in | Wt 182.0 lb

## 2024-03-24 DIAGNOSIS — Z860100 Personal history of colon polyps, unspecified: Secondary | ICD-10-CM

## 2024-03-24 DIAGNOSIS — Z1211 Encounter for screening for malignant neoplasm of colon: Secondary | ICD-10-CM | POA: Diagnosis present

## 2024-03-24 DIAGNOSIS — D122 Benign neoplasm of ascending colon: Secondary | ICD-10-CM | POA: Diagnosis not present

## 2024-03-24 MED ORDER — SODIUM CHLORIDE 0.9 % IV SOLN: 500.0000 mL | Freq: Once | INTRAVENOUS | Status: DC

## 2024-03-24 NOTE — Progress Notes (Signed)
 History and Physical:  This patient presents for endoscopic testing for: Encounter Diagnosis  Name Primary?   Hx of colonic polyps Yes    Surveillance colonoscopy today Diminutive TA polyp on 1st colonoscopy March 2018 Patient denies chronic abdominal pain, rectal bleeding, constipation or diarrhea.   Patient is otherwise without complaints or active issues today.   Past Medical History: Past Medical History:  Diagnosis Date   Allergy    History of elevated PSA    IT band syndrome      Past Surgical History: Past Surgical History:  Procedure Laterality Date   APPENDECTOMY  1995   KNEE SURGERY Left    11 years ago. Meniscal tear repair arthroscopic.   VASECTOMY  2017    Allergies: No Known Allergies  Outpatient Meds: Current Outpatient Medications  Medication Sig Dispense Refill   citalopram  (CELEXA ) 10 MG tablet TAKE 1 TABLET(10 MG) BY MOUTH DAILY 90 tablet 1   MAGNESIUM PO Take by mouth 2 (two) times daily.     Current Facility-Administered Medications  Medication Dose Route Frequency Provider Last Rate Last Admin   0.9 %  sodium chloride  infusion  500 mL Intravenous Once Danis, Laiklyn Pilkenton L III, MD          ___________________________________________________________________ Objective   Exam:  BP 118/70   Pulse 60   Temp 98.3 F (36.8 C) (Temporal)   Ht 5\' 10"  (1.778 m)   Wt 182 lb (82.6 kg)   SpO2 96%   BMI 26.11 kg/m   CV: regular , S1/S2 Resp: clear to auscultation bilaterally, normal RR and effort noted GI: soft, no tenderness, with active bowel sounds.   Assessment: Encounter Diagnosis  Name Primary?   Hx of colonic polyps Yes     Plan: Colonoscopy   The benefits and risks of the planned procedure(s) were described in detail with the patient or (when appropriate) their health care proxy.  Risks were outlined as including, but not limited to, bleeding, infection, perforation, adverse medication reaction leading to cardiac or pulmonary  decompensation, pancreatitis (if ERCP).  The limitation of incomplete mucosal visualization was also discussed.  No guarantees or warranties were given.  The patient is appropriate for an endoscopic procedure in the ambulatory setting.   - Lorella Roles, MD

## 2024-03-24 NOTE — Op Note (Signed)
  [facility] Patient Name: Brian Lowery Procedure Date: 03/24/2024 12:49 PM MRN: [id number] Endoscopist: Ace Abu L. Dominic Friendly , MD, [id number] Age: 58 Referring MD:  Date of Birth: [birth year] Gender: Male Account #: [id number] Procedure:                Colonoscopy Indications:              Surveillance: Personal history of adenomatous                            polyps on last colonoscopy > 5 years ago Medicines:                Monitored Anesthesia Care Procedure:                Pre-Anesthesia Assessment:                           - Prior to the procedure, a History and Physical                            was performed, and patient medications and                            allergies were reviewed. The patient's tolerance of                            previous anesthesia was also reviewed. The risks                            and benefits of the procedure and the sedation                            options and risks were discussed with the patient.                            All questions were answered, and informed consent                            was obtained. Prior Anticoagulants: The patient has                            taken no anticoagulant or antiplatelet agents. ASA                            Grade Assessment: II - A patient with mild systemic                            disease. After reviewing the risks and benefits,                            the patient was deemed in satisfactory condition to                            undergo the procedure.  After obtaining informed consent, the colonoscope                            was passed under direct vision. Throughout the                            procedure, the patient's blood pressure, pulse, and                            oxygen saturations were monitored continuously. The                            Olympus Scope SN: [id number] was introduced through                            the anus and advanced  to the the cecum, identified                            by appendiceal orifice and ileocecal valve. The                            colonoscopy was performed without difficulty. The                            patient tolerated the procedure well. The quality                            of the bowel preparation was excellent. The                            ileocecal valve, appendiceal orifice, and rectum                            were photographed. Scope In: 1:19:12 PM Scope Out: 1:35:10 PM Scope Withdrawal Time: 0 hours 12 minutes 37 seconds  Total Procedure Duration: 0 hours 15 minutes 58 seconds  Findings:                 The perianal and digital rectal examinations were                            normal.                           Repeat examination of right colon under NBI                            performed.                           A diminutive polyp was found in the distal                            ascending colon. The polyp was sessile. The polyp  was removed with a cold snare. Resection and                            retrieval were complete.                           The exam was otherwise without abnormality on                            direct and retroflexion views. Complications:            No immediate complications. Estimated Blood Loss:     Estimated blood loss was minimal. Impression:               - One diminutive polyp in the distal ascending                            colon, removed with a cold snare. Resected and                            retrieved.                           - The examination was otherwise normal on direct                            and retroflexion views. Recommendation:           - Patient has a contact number available for                            emergencies. The signs and symptoms of potential                            delayed complications were discussed with the                            patient. Return to  normal activities tomorrow.                            Written discharge instructions were provided to the                            patient.                           - Resume previous diet.                           - Continue present medications.                           - Await pathology results.                           - Repeat colonoscopy is recommended for  surveillance. The colonoscopy date will be                            determined after pathology results from today's                            exam become available for review. Mylisa Brunson L. Dominic Friendly, MD 03/24/2024 1:39:54 PM This report has been signed electronically.

## 2024-03-24 NOTE — Progress Notes (Signed)
 Called to room to assist during endoscopic procedure.  Patient ID and intended procedure confirmed with present staff. Received instructions for my participation in the procedure from the performing physician.

## 2024-03-24 NOTE — Patient Instructions (Addendum)
 Resume previous diet Continue present medications Await pathology results Repeat colonoscopy for surveillance. Procedure date will depend on biopsy results. See handout for polyps YOU HAD AN ENDOSCOPIC PROCEDURE TODAY AT THE [facility]:   Refer to the procedure report that was given to you for any specific questions about what was found during the examination.  If the procedure report does not answer your questions, please call your gastroenterologist to clarify.  If you requested that your care partner not be given the details of your procedure findings, then the procedure report has been included in a sealed envelope for you to review at your convenience later.  YOU SHOULD EXPECT: Some feelings of bloating in the abdomen. Passage of more gas than usual.  Walking can help get rid of the air that was put into your GI tract during the procedure and reduce the bloating. If you had a lower endoscopy (such as a colonoscopy or flexible sigmoidoscopy) you may notice spotting of blood in your stool or on the toilet paper. If you underwent a bowel prep for your procedure, you may not have a normal bowel movement for a few days.  Please Note:  You might notice some irritation and congestion in your nose or some drainage.  This is from the oxygen used during your procedure.  There is no need for concern and it should clear up in a day or so.  SYMPTOMS TO REPORT IMMEDIATELY:  Following lower endoscopy (colonoscopy or flexible sigmoidoscopy):  Excessive amounts of blood in the stool  Significant tenderness or worsening of abdominal pains  Swelling of the abdomen that is new, acute  Fever of 100F or higher  For urgent or emergent issues, a gastroenterologist can be reached at any hour by calling [phone]. Do not use MyChart messaging for urgent concerns.   DIET:  We do recommend a small meal at first, but then you may proceed to your regular diet.  Drink plenty of fluids but you should  avoid alcoholic beverages for 24 hours.  ACTIVITY:  You should plan to take it easy for the rest of today and you should NOT DRIVE or use heavy machinery until tomorrow (because of the sedation medicines used during the test).    FOLLOW UP: Our staff will call the number listed on your records the next business day following your procedure.  We will call around 7:15- 8:00 am to check on you and address any questions or concerns that you may have regarding the information given to you following your procedure. If we do not reach you, we will leave a message.     If any biopsies were taken you will be contacted by phone or by letter within the next 1-3 weeks.  Please call us  at [phone] if you have not heard about the biopsies in 3 weeks.   SIGNATURES/CONFIDENTIALITY: You and/or your care partner have signed paperwork which will be entered into your electronic medical record.  These signatures attest to the fact that that the information above on your After Visit Summary has been reviewed and is understood.  Full responsibility of the confidentiality of this discharge information lies with you and/or your care-partner.

## 2024-03-24 NOTE — Progress Notes (Signed)
 Pt's states no medical or surgical changes since previsit or office visit.

## 2024-03-24 NOTE — Progress Notes (Signed)
 Report given to PACU, vss

## 2024-03-25 ENCOUNTER — Telehealth: Payer: Self-pay

## 2024-03-25 ENCOUNTER — Encounter: Admitting: Gastroenterology

## 2024-03-25 NOTE — Telephone Encounter (Signed)
  Follow up Call-     03/24/2024   12:50 PM  Call back number  Post procedure Call Back phone  # [phone]  Permission to leave phone message Yes     Patient questions:  Do you have a fever, pain , or abdominal swelling? No. Pain Score  0 *  Have you tolerated food without any problems? Yes.    Have you been able to return to your normal activities? Yes.    Do you have any questions about your discharge instructions: Diet   No. Medications  No. Follow up visit  No.  Do you have questions or concerns about your Care? No.  Actions: * If pain score is 4 or above: No action needed, pain <4.

## 2024-03-29 LAB — SURGICAL PATHOLOGY

## 2024-04-04 ENCOUNTER — Encounter: Payer: Self-pay | Admitting: Gastroenterology

## 2024-07-19 ENCOUNTER — Other Ambulatory Visit: Payer: Self-pay | Admitting: Family Medicine

## 2024-07-19 DIAGNOSIS — F4323 Adjustment disorder with mixed anxiety and depressed mood: Secondary | ICD-10-CM

## 2024-09-30 ENCOUNTER — Other Ambulatory Visit: Payer: Self-pay | Admitting: Nurse Practitioner

## 2024-09-30 DIAGNOSIS — C61 Malignant neoplasm of prostate: Secondary | ICD-10-CM

## 2024-10-24 ENCOUNTER — Other Ambulatory Visit: Payer: Self-pay | Admitting: Family Medicine

## 2024-10-24 DIAGNOSIS — F4323 Adjustment disorder with mixed anxiety and depressed mood: Secondary | ICD-10-CM

## 2024-11-21 ENCOUNTER — Other Ambulatory Visit: Payer: Self-pay | Admitting: Family Medicine

## 2024-11-21 DIAGNOSIS — F4323 Adjustment disorder with mixed anxiety and depressed mood: Secondary | ICD-10-CM

## 2024-11-21 NOTE — Telephone Encounter (Signed)
>  1 year since last visit, pt needs appt

## 2024-11-28 ENCOUNTER — Encounter: Payer: Self-pay | Admitting: Family Medicine

## 2024-11-28 DIAGNOSIS — F4323 Adjustment disorder with mixed anxiety and depressed mood: Secondary | ICD-10-CM

## 2024-11-28 MED ORDER — CITALOPRAM HYDROBROMIDE 10 MG PO TABS: ORAL_TABLET | ORAL | 0 refills | Status: DC

## 2024-12-02 ENCOUNTER — Ambulatory Visit
Admission: RE | Admit: 2024-12-02 | Discharge: 2024-12-02 | Disposition: A | Source: Ambulatory Visit | Attending: Nurse Practitioner

## 2024-12-02 DIAGNOSIS — C61 Malignant neoplasm of prostate: Secondary | ICD-10-CM

## 2024-12-02 MED ORDER — GADOPICLENOL 0.5 MMOL/ML IV SOLN
7.5000 mL | Freq: Once | INTRAVENOUS | Status: AC | PRN
  Administered 2024-12-02: 7.5 mL via INTRAVENOUS

## 2024-12-06 ENCOUNTER — Other Ambulatory Visit: Payer: Self-pay | Admitting: Medical Genetics

## 2024-12-09 ENCOUNTER — Ambulatory Visit: Admitting: Family Medicine

## 2024-12-09 ENCOUNTER — Encounter: Payer: Self-pay | Admitting: Family Medicine

## 2024-12-09 ENCOUNTER — Ambulatory Visit

## 2024-12-09 VITALS — BP 120/62 | HR 87 | Temp 98.5°F | Ht 70.0 in | Wt 193.3 lb

## 2024-12-09 DIAGNOSIS — F4323 Adjustment disorder with mixed anxiety and depressed mood: Secondary | ICD-10-CM | POA: Diagnosis not present

## 2024-12-09 DIAGNOSIS — Z23 Encounter for immunization: Secondary | ICD-10-CM

## 2024-12-09 DIAGNOSIS — E785 Hyperlipidemia, unspecified: Secondary | ICD-10-CM | POA: Diagnosis not present

## 2024-12-09 DIAGNOSIS — Z0000 Encounter for general adult medical examination without abnormal findings: Secondary | ICD-10-CM | POA: Diagnosis not present

## 2024-12-09 DIAGNOSIS — M20001 Unspecified deformity of right finger(s): Secondary | ICD-10-CM

## 2024-12-09 DIAGNOSIS — Z8546 Personal history of malignant neoplasm of prostate: Secondary | ICD-10-CM

## 2024-12-09 LAB — LIPID PANEL
Cholesterol: 239 mg/dL — ABNORMAL HIGH (ref 28–200)
HDL: 60.6 mg/dL
LDL Cholesterol: 126 mg/dL — ABNORMAL HIGH (ref 10–99)
NonHDL: 178.28
Total CHOL/HDL Ratio: 4
Triglycerides: 261 mg/dL — ABNORMAL HIGH (ref 10.0–149.0)
VLDL: 52.2 mg/dL — ABNORMAL HIGH (ref 0.0–40.0)

## 2024-12-09 LAB — COMPREHENSIVE METABOLIC PANEL WITH GFR
ALT: 23 U/L (ref 3–53)
AST: 20 U/L (ref 5–37)
Albumin: 4.5 g/dL (ref 3.5–5.2)
Alkaline Phosphatase: 62 U/L (ref 39–117)
BUN: 15 mg/dL (ref 6–23)
CO2: 31 meq/L (ref 19–32)
Calcium: 9.6 mg/dL (ref 8.4–10.5)
Chloride: 102 meq/L (ref 96–112)
Creatinine, Ser: 0.79 mg/dL (ref 0.40–1.50)
GFR: 97.99 mL/min
Glucose, Bld: 108 mg/dL — ABNORMAL HIGH (ref 70–99)
Potassium: 3.9 meq/L (ref 3.5–5.1)
Sodium: 140 meq/L (ref 135–145)
Total Bilirubin: 1.1 mg/dL (ref 0.2–1.2)
Total Protein: 6.8 g/dL (ref 6.0–8.3)

## 2024-12-09 LAB — CBC WITH DIFFERENTIAL/PLATELET
Basophils Absolute: 0.1 K/uL (ref 0.0–0.1)
Basophils Relative: 0.9 % (ref 0.0–3.0)
Eosinophils Absolute: 0.3 K/uL (ref 0.0–0.7)
Eosinophils Relative: 4.5 % (ref 0.0–5.0)
HCT: 46.1 % (ref 39.0–52.0)
Hemoglobin: 16.2 g/dL (ref 13.0–17.0)
Lymphocytes Relative: 25.5 % (ref 12.0–46.0)
Lymphs Abs: 1.4 K/uL (ref 0.7–4.0)
MCHC: 35.2 g/dL (ref 30.0–36.0)
MCV: 91.1 fl (ref 78.0–100.0)
Monocytes Absolute: 0.5 K/uL (ref 0.1–1.0)
Monocytes Relative: 9.3 % (ref 3.0–12.0)
Neutro Abs: 3.4 K/uL (ref 1.4–7.7)
Neutrophils Relative %: 59.8 % (ref 43.0–77.0)
Platelets: 211 K/uL (ref 150.0–400.0)
RBC: 5.06 Mil/uL (ref 4.22–5.81)
RDW: 13.1 % (ref 11.5–15.5)
WBC: 5.6 K/uL (ref 4.0–10.5)

## 2024-12-09 LAB — TSH: TSH: 1.5 u[IU]/mL (ref 0.35–5.50)

## 2024-12-09 MED ORDER — CITALOPRAM HYDROBROMIDE 10 MG PO TABS: ORAL_TABLET | ORAL | 0 refills | Status: DC

## 2024-12-09 NOTE — Assessment & Plan Note (Signed)
 In remission
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
## 2024-12-12 ENCOUNTER — Ambulatory Visit: Payer: Self-pay | Admitting: Family Medicine

## 2024-12-12 DIAGNOSIS — S62666A Nondisplaced fracture of distal phalanx of right little finger, initial encounter for closed fracture: Secondary | ICD-10-CM

## 2024-12-28 ENCOUNTER — Other Ambulatory Visit: Payer: Self-pay | Admitting: Family Medicine

## 2024-12-28 DIAGNOSIS — F4323 Adjustment disorder with mixed anxiety and depressed mood: Secondary | ICD-10-CM
